# Patient Record
Sex: Male | Born: 1960
Health system: Southern US, Community
[De-identification: ages and names within clinical notes are randomized; demographics above are authoritative.]

## PROBLEM LIST (undated history)

## (undated) DIAGNOSIS — I10 Essential (primary) hypertension: Secondary | ICD-10-CM

## (undated) DIAGNOSIS — I2510 Atherosclerotic heart disease of native coronary artery without angina pectoris: Secondary | ICD-10-CM

---

## 2004-10-16 ENCOUNTER — Emergency Department (HOSPITAL_COMMUNITY): Admission: EM | Admit: 2004-10-16 | Discharge: 2004-10-16 | Payer: Self-pay | Admitting: Emergency Medicine

## 2009-09-06 ENCOUNTER — Emergency Department (HOSPITAL_COMMUNITY): Admission: EM | Admit: 2009-09-06 | Discharge: 2009-09-06 | Payer: Self-pay | Admitting: Emergency Medicine

## 2009-12-03 ENCOUNTER — Ambulatory Visit: Payer: Self-pay | Admitting: Oncology

## 2009-12-08 LAB — COMPREHENSIVE METABOLIC PANEL
Alkaline Phosphatase: 79 U/L (ref 39–117)
BUN: 11 mg/dL (ref 6–23)
CO2: 29 mEq/L (ref 19–32)
Chloride: 102 mEq/L (ref 96–112)
Glucose, Bld: 89 mg/dL (ref 70–99)
Potassium: 3.7 mEq/L (ref 3.5–5.3)
Sodium: 139 mEq/L (ref 135–145)
Total Bilirubin: 1.7 mg/dL — ABNORMAL HIGH (ref 0.3–1.2)
Total Protein: 7.5 g/dL (ref 6.0–8.3)

## 2009-12-08 LAB — CBC WITH DIFFERENTIAL/PLATELET
Basophils Absolute: 0 10*3/uL (ref 0.0–0.1)
EOS%: 0.4 % (ref 0.0–7.0)
Eosinophils Absolute: 0 10*3/uL (ref 0.0–0.5)
HGB: 16.2 g/dL (ref 13.0–17.1)
MCV: 95.3 fL (ref 79.3–98.0)
NEUT#: 3.6 10*3/uL (ref 1.5–6.5)
RDW: 13.2 % (ref 11.0–14.6)
lymph#: 2.2 10*3/uL (ref 0.9–3.3)

## 2009-12-12 LAB — FERRITIN: Ferritin: 404 ng/mL — ABNORMAL HIGH (ref 22–322)

## 2009-12-12 LAB — IRON AND TIBC: Iron: 148 ug/dL (ref 42–165)

## 2009-12-19 ENCOUNTER — Ambulatory Visit (HOSPITAL_COMMUNITY): Admission: RE | Admit: 2009-12-19 | Discharge: 2009-12-19 | Payer: Self-pay | Admitting: Oncology

## 2010-11-08 LAB — COMPREHENSIVE METABOLIC PANEL
ALT: 90 U/L — ABNORMAL HIGH (ref 0–53)
AST: 51 U/L — ABNORMAL HIGH (ref 0–37)
Albumin: 4.5 g/dL (ref 3.5–5.2)
BUN: 15 mg/dL (ref 6–23)
CO2: 26 mEq/L (ref 19–32)
Calcium: 9.2 mg/dL (ref 8.4–10.5)
Chloride: 99 mEq/L (ref 96–112)
Creatinine, Ser: 0.72 mg/dL (ref 0.4–1.5)
GFR calc non Af Amer: >60 mL/min (ref >60)
Glucose, Bld: 96 mg/dL (ref 70–99)
Total Protein: 7.5 g/dL (ref 6.0–8.3)

## 2010-11-08 LAB — CBC
HCT: 48.7 % (ref 39.0–52.0)
Hemoglobin: 17.4 g/dL — ABNORMAL HIGH (ref 13.0–17.0)
MCHC: 35.7 g/dL (ref 30.0–36.0)
RBC: 5.21 MIL/uL (ref 4.22–5.81)

## 2010-11-08 LAB — DIFFERENTIAL
Eosinophils Relative: 1 % (ref 0–5)
Lymphocytes Relative: 33 % (ref 12–46)
Lymphs Abs: 2.4 10*3/uL (ref 0.7–4.0)
Monocytes Absolute: 0.4 10*3/uL (ref 0.1–1.0)

## 2010-11-08 LAB — POCT CARDIAC MARKERS

## 2016-04-07 ENCOUNTER — Ambulatory Visit: Payer: Self-pay

## 2018-04-26 ENCOUNTER — Other Ambulatory Visit: Payer: Self-pay

## 2018-04-26 ENCOUNTER — Encounter (HOSPITAL_COMMUNITY): Payer: Self-pay | Admitting: Emergency Medicine

## 2018-04-26 ENCOUNTER — Emergency Department (HOSPITAL_COMMUNITY)
Admission: EM | Admit: 2018-04-26 | Discharge: 2018-04-27 | Disposition: A | Discharge status: Home / Self Care | Payer: Self-pay | Attending: Emergency Medicine | Admitting: Emergency Medicine

## 2018-04-26 ENCOUNTER — Emergency Department (HOSPITAL_COMMUNITY): Payer: Self-pay

## 2018-04-26 DIAGNOSIS — I10 Essential (primary) hypertension: Secondary | ICD-10-CM | POA: Insufficient documentation

## 2018-04-26 DIAGNOSIS — R079 Chest pain, unspecified: Secondary | ICD-10-CM | POA: Insufficient documentation

## 2018-04-26 LAB — BASIC METABOLIC PANEL
ANION GAP: 11 (ref 5–15)
BUN: 17 mg/dL (ref 6–20)
CALCIUM: 9.3 mg/dL (ref 8.9–10.3)
CO2: 25 mmol/L (ref 22–32)
Chloride: 103 mmol/L (ref 98–111)
Creatinine, Ser: 1.08 mg/dL (ref 0.61–1.24)
GFR calc Af Amer: >60 mL/min (ref >60)
GFR calc non Af Amer: >60 mL/min (ref >60)
GLUCOSE: 105 mg/dL — AB (ref 70–99)
POTASSIUM: 3.7 mmol/L (ref 3.5–5.1)
Sodium: 139 mmol/L (ref 135–145)

## 2018-04-26 LAB — CBC WITH DIFFERENTIAL/PLATELET
Abs Immature Granulocytes: 0 10*3/uL (ref 0.0–0.1)
BASOS ABS: 0 10*3/uL (ref 0.0–0.1)
BASOS PCT: 0 %
EOS ABS: 0.1 10*3/uL (ref 0.0–0.7)
EOS PCT: 2 %
HCT: 44.5 % (ref 39.0–52.0)
Hemoglobin: 15.7 g/dL (ref 13.0–17.0)
Immature Granulocytes: 0 %
Lymphocytes Relative: 38 %
Lymphs Abs: 2.3 10*3/uL (ref 0.7–4.0)
MCH: 33.2 pg (ref 26.0–34.0)
MCHC: 35.3 g/dL (ref 30.0–36.0)
MCV: 94.1 fL (ref 78.0–100.0)
MONO ABS: 0.6 10*3/uL (ref 0.1–1.0)
Monocytes Relative: 9 %
Neutro Abs: 3 10*3/uL (ref 1.7–7.7)
Neutrophils Relative %: 51 %
Platelets: 162 10*3/uL (ref 150–400)
RBC: 4.73 MIL/uL (ref 4.22–5.81)
RDW: 12 % (ref 11.5–15.5)
WBC: 6 10*3/uL (ref 4.0–10.5)

## 2018-04-26 MED ORDER — METOPROLOL TARTRATE 25 MG PO TABS
50.0000 mg | ORAL_TABLET | Freq: Once | ORAL | Status: AC
  Administered 2018-04-26: 50 mg via ORAL
  Filled 2018-04-26: qty 2

## 2018-04-26 NOTE — ED Triage Notes (Signed)
Pt states they took his BP at home and it was elevated in the 190 systolic range. Pt is compliant with BP meds at home. No other symptoms .

## 2018-04-26 NOTE — ED Provider Notes (Signed)
[facility] EMERGENCY DEPARTMENT Provider Note   CSN: [id number] Arrival date & time: 04/26/18  2142     History   Chief Complaint Chief Complaint  Patient presents with  . Hypertension    HPI Andrew Hunter is a 57 y.o. male.  The history is provided by the patient and medical records.  Hypertension     57 year old male with history of hypertension and BPH, presenting to the ED with high blood pressure.  Patient reports for the past few days he has had a "uneasy feeling" and has felt that his blood pressure has been elevated.  States he checked it twice this evening and was elevated into the 190's systolic both times.  States this made him panic and get anxious so he decided to come in.  States he takes losartan 50 mg in the morning and metoprolol 50 mg in the evening.  He has not yet taking his nighttime dose of metoprolol today, however otherwise has not missed any doses.  States for the past few days he has been experiencing some "twinges" in his chest--he cannot tell if this is related to his anxiety or his blood pressure.  He has not had any shortness of breath, diaphoresis, nausea, vomiting, dizziness, or weakness.  He has no known cardiac history.  He is not a smoker.  He has no known significant family cardiac history.  Wife reports she thinks he has some of this problems because of his sleep apnea.  Reports he had a sleep study done about 10+ years ago and was told he had sleep apnea, however he was not given prescription for CPAP machine and so never received it.  Wife reports he snores very heavily at night and she wakes up several times and notices he is not breathing very well.  He has not had any follow-up since his initial sleep study.  Patient also has concern about his urination.  States lately when he feels anxious he gets sudden urge to urinate.  He has not had any difficulty urinating or straining, but does have sensation of incomplete voiding.  Wife  reports he will get up 1-2 times at night to urinate, usually around 2 or 3 AM.  Patient reports he had his prostate checked recently and was told he had a "large prostate" but all of his numbers were normal.  History reviewed. No pertinent past medical history.  There are no active problems to display for this patient.   History reviewed. No pertinent surgical history.      Home Medications    Prior to Admission medications   Not on File    Family History No family history on file.  Social History Social History   Tobacco Use  . Smoking status: Never Smoker  . Smokeless tobacco: Never Used  Substance Use Topics  . Alcohol use: Not on file  . Drug use: Not on file     Allergies   Patient has no allergy information on record.   Review of Systems Review of Systems  Constitutional:       HTN  All other systems reviewed and are negative.    Physical Exam Updated Vital Signs BP (!) 188/85 (BP Location: Right Arm)   Pulse 79   Temp 98.5 F (36.9 C)   Resp 17   SpO2 100%   Physical Exam  Constitutional: He is oriented to person, place, and time. He appears well-developed and well-nourished.  HENT:  Head: Normocephalic and atraumatic.  Mouth/Throat: Oropharynx is clear and moist.  Eyes: Pupils are equal, round, and reactive to light. Conjunctivae and EOM are normal.  Neck: Normal range of motion.  Cardiovascular: Normal rate, regular rhythm and normal heart sounds.  Pulmonary/Chest: Effort normal and breath sounds normal. No stridor. No respiratory distress.  Abdominal: Soft. Bowel sounds are normal. There is no tenderness. There is no rebound.  Musculoskeletal: Normal range of motion.  Neurological: He is alert and oriented to person, place, and time.  Skin: Skin is warm and dry.  Psychiatric: He has a normal mood and affect.  Nursing note and vitals reviewed.    ED Treatments / Results  Labs (all labs ordered are listed, but only abnormal results  are displayed) Labs Reviewed  BASIC METABOLIC PANEL - Abnormal; Notable for the following components:      Result Value   Glucose, Bld 105 (*)    All other components within normal limits  URINALYSIS, ROUTINE W REFLEX MICROSCOPIC - Abnormal; Notable for the following components:   Color, Urine COLORLESS (*)    Specific Gravity, Urine 1.003 (*)    Hgb urine dipstick SMALL (*)    All other components within normal limits  CBC WITH DIFFERENTIAL/PLATELET  I-STAT TROPONIN, ED    EKG None  Radiology Dg Chest 2 View  Result Date: 04/26/2018 CLINICAL DATA:  Chest pain EXAM: CHEST - 2 VIEW COMPARISON:  09/06/2009 FINDINGS: The heart size and mediastinal contours are within normal limits. Both lungs are clear. The visualized skeletal structures are unremarkable. IMPRESSION: No active cardiopulmonary disease. Electronically Signed   By: Jasmine Pang M.D.   On: 04/26/2018 23:32    Procedures Procedures (including critical care time)  Medications Ordered in ED Medications  metoprolol tartrate (LOPRESSOR) tablet 50 mg (50 mg Oral Given 04/26/18 2323)     Initial Impression / Assessment and Plan / ED Course  I have reviewed the triage vital signs and the nursing notes.  Pertinent labs & imaging results that were available during my care of the patient were reviewed by me and considered in my medical decision making (see chart for details).  57 year old male presenting to the ED with elevated blood pressure.  Blood pressure today in the 190 systolic which is high for him.  Takes losartan 50 mg in the morning, metoprolol 50 mg in the evening.  Has not yet had his evening metoprolol, has not missed any other doses of medication.  He is afebrile and nontoxic.  His blood pressure is elevated but not within urgency/emergency range.  Does report some vague chest pain over the past few days, unsure if this is related to his blood pressure or anxiety as he does get very nervous when her his blood  pressure is elevated.  EKG here is nonischemic.  Screening labs are overall reassuring.  No evidence of endorgan damage.  Chest x-ray is clear.  After dose of his home metoprolol, blood pressure has stabilized, 126/82 during my reassessment.  Denies any active chest pain here.  Feel he is stable for discharge home.  May need to adjust timing of his evening metoprolol to earlier in the evening.  Also recommended that he keep a log of his blood pressures, checking about the same time every day but no more than twice a day.  In regards to wife's concerns about sleep apnea, can follow-up with PCP for this.  He does report tiredness even after sleeping all night and wife's reports of snoring and cessation of breathing are  concerning for such.  May need repeat sleep study.  In regards to urinary symptoms-- UA without signs of infection.  It sounds like he does have incomplete voiding, likely secondary to BPH.  No incontinence or LE weakness concerning for cauda equina.  Can follow-up with PCP and/or urology for this.    Discussed plan with patient and wife, they both acknowledged understanding and agreed with plan of care.  Return precautions given for new or worsening symptoms.  Final Clinical Impressions(s) / ED Diagnoses   Final diagnoses:  Essential hypertension  Chest pain in adult    ED Discharge Orders    None       Garlon Hatchet, PA-C 04/27/18 7846    Arby Barrette, MD 05/11/18 1054

## 2018-04-27 LAB — URINALYSIS, ROUTINE W REFLEX MICROSCOPIC
BILIRUBIN URINE: NEGATIVE
Bacteria, UA: NONE SEEN
Glucose, UA: NEGATIVE mg/dL
Ketones, ur: NEGATIVE mg/dL
LEUKOCYTES UA: NEGATIVE
NITRITE: NEGATIVE
PROTEIN: NEGATIVE mg/dL
Specific Gravity, Urine: 1.003 — ABNORMAL LOW (ref 1.005–1.030)
pH: 7 (ref 5.0–8.0)

## 2018-04-27 LAB — I-STAT TROPONIN, ED: Troponin i, poc: 0 ng/mL (ref 0.00–0.08)

## 2018-04-27 NOTE — Discharge Instructions (Addendum)
Continue your regular blood pressure medications-- may want to take your metoprolol a little earlier in the evening, maybe around dinner time. Keep a log of your blood pressures, check no more than twice daily, preferably same time(s) every day. Follow-up with your primary care doctor-- you can talk to them about sleep study, etc. Return here for any new/acute changes.

## 2018-10-04 ENCOUNTER — Emergency Department (HOSPITAL_COMMUNITY): Payer: Medicaid Other

## 2018-10-04 ENCOUNTER — Encounter (HOSPITAL_COMMUNITY): Payer: Self-pay | Admitting: *Deleted

## 2018-10-04 ENCOUNTER — Inpatient Hospital Stay (HOSPITAL_COMMUNITY)
Admission: EM | Admit: 2018-10-04 | Discharge: 2018-10-06 | DRG: 247 | Disposition: A | Discharge status: Home / Self Care | Payer: Medicaid Other | Attending: Internal Medicine | Admitting: Internal Medicine

## 2018-10-04 DIAGNOSIS — I351 Nonrheumatic aortic (valve) insufficiency: Secondary | ICD-10-CM | POA: Diagnosis present

## 2018-10-04 DIAGNOSIS — I214 Non-ST elevation (NSTEMI) myocardial infarction: Principal | ICD-10-CM | POA: Diagnosis present

## 2018-10-04 DIAGNOSIS — Z7982 Long term (current) use of aspirin: Secondary | ICD-10-CM

## 2018-10-04 DIAGNOSIS — Z8249 Family history of ischemic heart disease and other diseases of the circulatory system: Secondary | ICD-10-CM

## 2018-10-04 DIAGNOSIS — Z79899 Other long term (current) drug therapy: Secondary | ICD-10-CM | POA: Diagnosis not present

## 2018-10-04 DIAGNOSIS — I10 Essential (primary) hypertension: Secondary | ICD-10-CM | POA: Diagnosis present

## 2018-10-04 DIAGNOSIS — Z955 Presence of coronary angioplasty implant and graft: Secondary | ICD-10-CM

## 2018-10-04 DIAGNOSIS — I2510 Atherosclerotic heart disease of native coronary artery without angina pectoris: Secondary | ICD-10-CM | POA: Diagnosis present

## 2018-10-04 DIAGNOSIS — Z800 Family history of malignant neoplasm of digestive organs: Secondary | ICD-10-CM

## 2018-10-04 HISTORY — DX: Atherosclerotic heart disease of native coronary artery without angina pectoris: I25.10

## 2018-10-04 HISTORY — DX: Essential (primary) hypertension: I10

## 2018-10-04 LAB — BASIC METABOLIC PANEL
ANION GAP: 10 (ref 5–15)
BUN: 13 mg/dL (ref 6–20)
CALCIUM: 9.2 mg/dL (ref 8.9–10.3)
CO2: 26 mmol/L (ref 22–32)
Chloride: 100 mmol/L (ref 98–111)
Creatinine, Ser: 0.88 mg/dL (ref 0.61–1.24)
GFR calc Af Amer: >60 mL/min (ref >60)
GFR calc non Af Amer: >60 mL/min (ref >60)
GLUCOSE: 150 mg/dL — AB (ref 70–99)
Potassium: 4 mmol/L (ref 3.5–5.1)
Sodium: 136 mmol/L (ref 135–145)

## 2018-10-04 LAB — CBC
HCT: 42.8 % (ref 39.0–52.0)
HEMOGLOBIN: 15.3 g/dL (ref 13.0–17.0)
MCH: 33.1 pg (ref 26.0–34.0)
MCHC: 35.7 g/dL (ref 30.0–36.0)
MCV: 92.6 fL (ref 80.0–100.0)
PLATELETS: 186 10*3/uL (ref 150–400)
RBC: 4.62 MIL/uL (ref 4.22–5.81)
RDW: 12.2 % (ref 11.5–15.5)
WBC: 5.1 10*3/uL (ref 4.0–10.5)
nRBC: 0 % (ref 0.0–0.2)

## 2018-10-04 LAB — I-STAT TROPONIN, ED
Troponin i, poc: 0.01 ng/mL (ref 0.00–0.08)
Troponin i, poc: 0.09 ng/mL (ref 0.00–0.08)

## 2018-10-04 LAB — TROPONIN I: Troponin I: <0.03 ng/mL (ref <0.03)

## 2018-10-04 LAB — CBG MONITORING, ED: GLUCOSE-CAPILLARY: 91 mg/dL (ref 70–99)

## 2018-10-04 MED ORDER — SODIUM CHLORIDE 0.9% FLUSH
3.0000 mL | Freq: Two times a day (BID) | INTRAVENOUS | Status: DC
  Administered 2018-10-04: 3 mL via INTRAVENOUS

## 2018-10-04 MED ORDER — LOSARTAN POTASSIUM 50 MG PO TABS
100.0000 mg | ORAL_TABLET | Freq: Every day | ORAL | Status: DC
  Administered 2018-10-05 – 2018-10-06 (×2): 100 mg via ORAL
  Filled 2018-10-04 (×3): qty 2

## 2018-10-04 MED ORDER — SODIUM CHLORIDE 0.9 % WEIGHT BASED INFUSION
3.0000 mL/kg/h | INTRAVENOUS | Status: DC
  Administered 2018-10-05: 3 mL/kg/h via INTRAVENOUS

## 2018-10-04 MED ORDER — ASPIRIN EC 81 MG PO TBEC
81.0000 mg | DELAYED_RELEASE_TABLET | Freq: Every day | ORAL | Status: DC
  Administered 2018-10-06: 10:00:00 81 mg via ORAL
  Filled 2018-10-04: qty 1

## 2018-10-04 MED ORDER — SODIUM CHLORIDE 0.9% FLUSH: 3.0000 mL | INTRAVENOUS | Status: DC | PRN

## 2018-10-04 MED ORDER — SODIUM CHLORIDE 0.9 % IV BOLUS
1000.0000 mL | Freq: Once | INTRAVENOUS | Status: AC
  Administered 2018-10-04: 1000 mL via INTRAVENOUS

## 2018-10-04 MED ORDER — SODIUM CHLORIDE 0.9% FLUSH
3.0000 mL | Freq: Once | INTRAVENOUS | Status: AC
  Administered 2018-10-05: 12:00:00 3 mL via INTRAVENOUS

## 2018-10-04 MED ORDER — SODIUM CHLORIDE 0.9 % WEIGHT BASED INFUSION
1.0000 mL/kg/h | INTRAVENOUS | Status: DC
  Administered 2018-10-05: 1 mL/kg/h via INTRAVENOUS

## 2018-10-04 MED ORDER — LOSARTAN POTASSIUM 50 MG PO TABS
100.0000 mg | ORAL_TABLET | Freq: Every day | ORAL | Status: DC
  Filled 2018-10-04: qty 2

## 2018-10-04 MED ORDER — ONDANSETRON HCL 4 MG/2ML IJ SOLN: 4.0000 mg | Freq: Four times a day (QID) | INTRAMUSCULAR | Status: DC | PRN

## 2018-10-04 MED ORDER — METOPROLOL TARTRATE 25 MG PO TABS
25.0000 mg | ORAL_TABLET | Freq: Two times a day (BID) | ORAL | Status: DC
  Administered 2018-10-04 – 2018-10-06 (×4): 25 mg via ORAL
  Filled 2018-10-04: qty 1
  Filled 2018-10-04: qty 2
  Filled 2018-10-04 (×2): qty 1

## 2018-10-04 MED ORDER — ASPIRIN EC 81 MG PO TBEC: 81.0000 mg | DELAYED_RELEASE_TABLET | Freq: Every day | ORAL | Status: DC

## 2018-10-04 MED ORDER — HEPARIN (PORCINE) 25000 UT/250ML-% IV SOLN
1200.0000 [IU]/h | INTRAVENOUS | Status: DC
  Administered 2018-10-04: 950 [IU]/h via INTRAVENOUS
  Filled 2018-10-04: qty 250

## 2018-10-04 MED ORDER — SODIUM CHLORIDE 0.9 % IV SOLN: 250.0000 mL | INTRAVENOUS | Status: DC | PRN

## 2018-10-04 MED ORDER — ASPIRIN 81 MG PO CHEW
81.0000 mg | CHEWABLE_TABLET | ORAL | Status: AC
  Administered 2018-10-05: 81 mg via ORAL
  Filled 2018-10-04: qty 1

## 2018-10-04 MED ORDER — ATORVASTATIN CALCIUM 80 MG PO TABS
80.0000 mg | ORAL_TABLET | Freq: Every day | ORAL | Status: DC
  Administered 2018-10-05: 80 mg via ORAL
  Filled 2018-10-04: qty 1

## 2018-10-04 MED ORDER — NITROGLYCERIN 0.4 MG SL SUBL: 0.4000 mg | SUBLINGUAL_TABLET | SUBLINGUAL | Status: DC | PRN

## 2018-10-04 MED ORDER — ASPIRIN 81 MG PO CHEW
324.0000 mg | CHEWABLE_TABLET | Freq: Once | ORAL | Status: AC
  Administered 2018-10-04: 324 mg via ORAL
  Filled 2018-10-04: qty 4

## 2018-10-04 MED ORDER — HEPARIN BOLUS VIA INFUSION
4000.0000 [IU] | Freq: Once | INTRAVENOUS | Status: AC
  Administered 2018-10-04: 4000 [IU] via INTRAVENOUS
  Filled 2018-10-04: qty 4000

## 2018-10-04 MED ORDER — FAMOTIDINE 20 MG PO TABS
20.0000 mg | ORAL_TABLET | Freq: Every day | ORAL | Status: DC
  Administered 2018-10-04 – 2018-10-06 (×3): 20 mg via ORAL
  Filled 2018-10-04 (×3): qty 1

## 2018-10-04 MED ORDER — ACETAMINOPHEN 325 MG PO TABS: 650.0000 mg | ORAL_TABLET | ORAL | Status: DC | PRN

## 2018-10-04 MED ORDER — NITROGLYCERIN IN D5W 200-5 MCG/ML-% IV SOLN
0.0000 ug/min | INTRAVENOUS | Status: DC
  Administered 2018-10-04: 5 ug/min via INTRAVENOUS
  Administered 2018-10-05: 15 ug/min via INTRAVENOUS
  Filled 2018-10-04: qty 250

## 2018-10-04 NOTE — Progress Notes (Signed)
ANTICOAGULATION CONSULT NOTE - Follow Up Consult  Pharmacy Consult for heparin Indication: NSTEMI  No Known Allergies  Patient Measurements: TBW: 79.4 kg Heparin Dosing Weight: 79.4 kg   Vital Signs: Temp: 98.6 F (37 C) (02/12 1351) Temp Source: Oral (02/12 1351) BP: 154/95 (02/12 1845) Pulse Rate: 73 (02/12 1845)  Labs: Recent Labs    10/04/18 1446  HGB 15.3  HCT 42.8  PLT 186  CREATININE 0.88    CrCl cannot be calculated (Unknown ideal weight.).  Assessment: 58 yo M presents with CP. Found to have NSTEMI. No anticoag PTA. CBC stable.  Goal of Therapy:  Heparin level 0.3-0.7 units/ml Monitor platelets by anticoagulation protocol: Yes   Plan:  Give heparin 4,000 unit bolus Start heparin gtt at 950 units/hr  Monitor daily heparin level, CBC, s/s of bleed  Armandina Stammer 10/04/2018,7:05 PM

## 2018-10-04 NOTE — ED Notes (Signed)
RN messaged admitting about PO medications.

## 2018-10-04 NOTE — ED Provider Notes (Signed)
[facility] EMERGENCY DEPARTMENT Provider Note   CSN: [id number] Arrival date & time: 10/04/18  1348     History   Chief Complaint Chief Complaint  Patient presents with  . Chest Pain    HPI Andrew Hunter is a 58 y.o. male with a past medical history of hypertension presents to ED for 1 week history of intermittent dizziness, chest pain shortness of breath.  States that his symptoms got worse in the past 2 days.  States that he will sometimes feel like the world is spinning when he stands up or is at work.  He does note paresthesias in left hand over the past several months.  He states that the chest pain has been intermittent with no specific aggravating or alleviating factor.  Is located in the left side of his chest.  No history of similar symptoms in the past.  Reports mild cough.  Denies any recent immobilization, history of MI, DVT, PE, abdominal pain, fever.  He has not tried any medications to help with his symptoms. Denies tobacco use. No personal or family history of CAD.  HPI  Past Medical History:  Diagnosis Date  . Essential hypertension     There are no active problems to display for this patient.   History reviewed. No pertinent surgical history.      Home Medications    Prior to Admission medications   Medication Sig Start Date End Date Taking? Authorizing Provider  losartan (COZAAR) 100 MG tablet Take 100 mg by mouth daily. 09/24/18  Yes [provider]  metoprolol tartrate (LOPRESSOR) 50 MG tablet Take 50 mg by mouth at bedtime. 02/14/18   [provider]    Family History History reviewed. No pertinent family history.  Social History Social History   Tobacco Use  . Smoking status: Never Smoker  . Smokeless tobacco: Never Used  Substance Use Topics  . Alcohol use: Not on file  . Drug use: Not on file     Allergies   Patient has no known allergies.   Review of Systems Review of Systems  Constitutional:  Negative for appetite change, chills and fever.  HENT: Negative for ear pain, rhinorrhea, sneezing and sore throat.   Eyes: Negative for photophobia and visual disturbance.  Respiratory: Positive for cough and shortness of breath. Negative for chest tightness and wheezing.   Cardiovascular: Positive for chest pain. Negative for palpitations.  Gastrointestinal: Negative for abdominal pain, blood in stool, constipation, diarrhea, nausea and vomiting.  Genitourinary: Negative for dysuria, hematuria and urgency.  Musculoskeletal: Negative for myalgias.  Skin: Negative for rash.  Neurological: Positive for dizziness. Negative for weakness and light-headedness.     Physical Exam Updated Vital Signs BP (!) 141/81   Pulse 79   Temp 98.6 F (37 C) (Oral)   Resp (!) 24   SpO2 97%   Physical Exam Vitals signs and nursing note reviewed.  Constitutional:      General: He is not in acute distress.    Appearance: He is well-developed.  HENT:     Head: Normocephalic and atraumatic.     Nose: Nose normal.  Eyes:     General: No scleral icterus.       Left eye: No discharge.     Conjunctiva/sclera: Conjunctivae normal.  Neck:     Musculoskeletal: Normal range of motion and neck supple.  Cardiovascular:     Rate and Rhythm: Normal rate and regular rhythm.     Heart sounds: Normal heart  sounds. No murmur. No friction rub. No gallop.   Pulmonary:     Effort: Pulmonary effort is normal. No respiratory distress.     Breath sounds: Normal breath sounds.  Abdominal:     General: Bowel sounds are normal. There is no distension.     Palpations: Abdomen is soft.     Tenderness: There is no abdominal tenderness. There is no guarding.  Musculoskeletal: Normal range of motion.     Comments: No lower extremity edema, erythema or calf tenderness bilaterally.  Skin:    General: Skin is warm and dry.     Findings: No rash.  Neurological:     General: No focal deficit present.     Mental Status: He  is alert and oriented to person, place, and time.     Cranial Nerves: No cranial nerve deficit.     Sensory: No sensory deficit.     Motor: No weakness or abnormal muscle tone.     Coordination: Coordination normal.      ED Treatments / Results  Labs (all labs ordered are listed, but only abnormal results are displayed) Labs Reviewed  BASIC METABOLIC PANEL - Abnormal; Notable for the following components:      Result Value   Glucose, Bld 150 (*)    All other components within normal limits  I-STAT TROPONIN, ED - Abnormal; Notable for the following components:   Troponin i, poc 0.09 (*)    All other components within normal limits  CBC  I-STAT TROPONIN, ED  I-STAT TROPONIN, ED  CBG MONITORING, ED    EKG EKG Interpretation  Date/Time:  Wednesday October 04 2018 13:56:45 EST Ventricular Rate:  94 PR Interval:  144 QRS Duration: 106 QT Interval:  374 QTC Calculation: 467 R Axis:   67 Text Interpretation:  Normal sinus rhythm Incomplete right bundle branch block Possible Inferior infarct , age undetermined Cannot rule out Anterior infarct , age undetermined Abnormal ECG Confirmed by Kennis Carina [phone]) on 10/04/2018 5:08:36 PM   Radiology Dg Chest 2 View  Result Date: 10/04/2018 CLINICAL DATA:  Chest pain and tingling in the finger tips. EXAM: CHEST - 2 VIEW COMPARISON:  04/26/2018 FINDINGS: The heart size and mediastinal contours are within normal limits. Both lungs are clear. The visualized skeletal structures are unremarkable. IMPRESSION: No active cardiopulmonary disease. Electronically Signed   By: Tollie Eth M.D.   On: 10/04/2018 14:30    Procedures Procedures (including critical care time)  CRITICAL CARE Performed by: Dietrich Pates   Total critical care time: 35 minutes  Critical care time was exclusive of separately billable procedures and treating other patients.  Critical care was necessary to treat or prevent imminent or life-threatening  deterioration.  Critical care was time spent personally by me on the following activities: development of treatment plan with patient and/or surrogate as well as nursing, discussions with consultants, evaluation of patient's response to treatment, examination of patient, obtaining history from patient or surrogate, ordering and performing treatments and interventions, ordering and review of laboratory studies, ordering and review of radiographic studies, pulse oximetry and re-evaluation of patient's condition.   Medications Ordered in ED Medications  sodium chloride flush (NS) 0.9 % injection 3 mL (has no administration in time range)  sodium chloride 0.9 % bolus 1,000 mL (1,000 mLs Intravenous New Bag/Given 10/04/18 1759)  aspirin chewable tablet 324 mg (has no administration in time range)     Initial Impression / Assessment and Plan / ED Course  I have reviewed  the triage vital signs and the nursing notes.  Pertinent labs & imaging results that were available during my care of the patient were reviewed by me and considered in my medical decision making (see chart for details).  Clinical Course as of Oct 05 1855  Wed Oct 04, 2018  1827 Troponin i, poc(!!): 0.09 [HK]    Clinical Course User Index [HK] Dietrich PatesKhatri, Dorothyann Mourer, [place]PA-C    58 year old male with past medical history of hypertension presents to ED for intermittent left-sided chest pain, shortness of breath, intermittent dizziness for the past 3 days.  Denies hemoptysis but does note occasional cough.  No history of similar symptoms in the past.  He has not tried any medications to help with his pain.  He is currently having "just a little" amount of chest pain.  Initial troponin was negative.  EKG shows tracing similar to prior.  BMP, CBC unremarkable.  Repeat troponin after 3 hours elevated to 0.09.  Initiated on aspirin, heparin and will consult cardiology for admission.    Portions of this note were generated with [employer] dictation  software. Dictation errors may occur despite best attempts at proofreading.   Final Clinical Impressions(s) / ED Diagnoses   Final diagnoses:  NSTEMI (non-ST elevated myocardial infarction) [facility](HCC)    ED Discharge Orders    None       Dietrich PatesKhatri, Shayon Trompeter, PA-C 10/04/18 2052    Sabas SousBero, Michael M, MD 10/04/18 2222

## 2018-10-04 NOTE — H&P (Addendum)
History & Physical    Patient ID: Andrew Hunter MRN: [id number], DOB/AGE: March 06, 1961   Admit date: 10/04/2018   Primary Physician: Patient, No Pcp Per Primary Cardiologist: New - G. Jacques NavyAcharya, MD  Patient Profile    58 year old male with a history of hypertension and otherwise no prior cardiac history, who presented to the emergency department today with left-sided chest pain and dyspnea and has been found to have an elevated troponin.  Past Medical History    Past Medical History:  Diagnosis Date  . Essential hypertension     History reviewed. No pertinent surgical history.   Allergies  No Known Allergies  History of Present Illness    58 year old male with a prior history of hypertension on losartan therapy as an outpatient.  He has no prior cardiac history, though he does have a family history of CAD, with his father dying of an myocardial infarction at age 60.  He says that over the past several months, he has been having intermittent rest and exertional left-sided chest discomfort sometimes associated with left arm tingling and dyspnea, occurring about 3 times a month, lasting about an hour, and resolving spontaneously.  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   He was in his usual state of health until this morning, when he was at work and lifting a heavy pipe off of the truck with the help of some coworkers.  At that point, he developed more severe left-sided chest pressure associated dyspnea, and tingling in his left arm and fingers.  He stopped working and symptoms persisted, prompting him to eventually present to the emergency department.  Here, ECG showed no acute changes while blood pressure was mildly elevated at 164/82.  Initial troponin was normal but subsequent troponin has returned at 0.09.  In that setting, he was just treated with  aspirin and heparin has been ordered.  He still reports mild 2-3 over 10 left-sided chest pressure and left arm tingling.  Home Medications    Prior to Admission medications   Medication Sig Start Date End Date Taking? Authorizing Provider  acetaminophen (TYLENOL) 500 MG tablet Take 500-1,000 mg by mouth every 6 (six) hours as needed (for pain or headaches).   Yes [provider]  aspirin EC 81 MG tablet Take 81 mg by mouth daily.   Yes [provider]  ibuprofen (ADVIL,MOTRIN) 200 MG tablet Take 400 mg by mouth every 6 (six) hours as needed (for pain).   Yes [provider]  losartan (COZAAR) 100 MG tablet Take 100 mg by mouth daily. 09/24/18  Yes [provider]  metoprolol tartrate (LOPRESSOR) 50 MG tablet Take 50 mg by mouth at bedtime. 02/14/18  Yes [provider]  Omega-3 Fatty Acids (FISH OIL PO) Take 1 capsule by mouth daily.   Yes [provider]  ranitidine (ZANTAC 75) 75 MG tablet Take 75 mg by mouth daily before breakfast.   Yes [provider]    Family History    Family History  Problem Relation Age of Onset  . Cancer Mother        liver  . Heart attack Father        died @ 559   He indicated that his mother is deceased. He indicated that his father is deceased.   Social History    Social History  Socioeconomic History  . Marital status: Single    Spouse name: Not on file  . Number of children: Not on file  . Years of education: Not on file  . Highest education level: Not on file  Occupational History  . Not on file  Social Needs  . Financial resource strain: Not on file  . Food insecurity:    Worry: Not on file    Inability: Not on file  . Transportation needs:    Medical: Not on file    Non-medical: Not on file  Tobacco Use  . Smoking status: Never Smoker  . Smokeless tobacco: Never Used  Substance and Sexual Activity  . Alcohol use: Yes    Comment: 2 beers/month  . Drug use: Never  .  Sexual activity: Not on file  Lifestyle  . Physical activity:    Days per week: Not on file    Minutes per session: Not on file  . Stress: Not on file  Relationships  . Social connections:    Talks on phone: Not on file    Gets together: Not on file    Attends religious service: Not on file    Active member of club or organization: Not on file    Attends meetings of clubs or organizations: Not on file    Relationship status: Not on file  . Intimate partner violence:    Fear of current or ex partner: Not on file    Emotionally abused: Not on file    Physically abused: Not on file    Forced sexual activity: Not on file  Other Topics Concern  . Not on file  Social History Narrative   Lives locally with wife.  Does not routinely exercise.  Works in Therapist, sports.     Review of Systems    General:  No chills, fever, night sweats or weight changes.  Cardiovascular:  +++ chest pain, +++ dyspnea on exertion, no edema, orthopnea, palpitations, paroxysmal nocturnal dyspnea. Dermatological: No rash, lesions/masses Respiratory: No cough, +++ dyspnea Urologic: No hematuria, dysuria Abdominal:   No nausea, vomiting, diarrhea, bright red blood per rectum, melena, or hematemesis Neurologic:  No visual changes, wkns, changes in mental status. All other systems reviewed and are otherwise negative except as noted above.  Physical Exam    Blood pressure (!) 153/93, pulse 87, temperature 98.6 F (37 C), temperature source Oral, resp. rate 13, height 5\' 7"  (1.702 m), weight 79.4 kg, SpO2 96 %.  General: Pleasant, NAD Psych: Normal affect. Neuro: Alert and oriented X 3. Moves all extremities spontaneously. HEENT: Normal  Neck: Supple without bruits or JVD. Lungs:  Resp regular and unlabored, CTA. Heart: RRR, 2/6 syst murmur @ LLSB, no s3, s4. Abdomen: Soft, non-tender, non-distended, BS + x 4.  Extremities: No clubbing, cyanosis or edema. DP/PT/Radials 2+ and equal bilaterally.  Labs      Troponin [facility] of Care Test) Recent Labs    10/04/18 1827  TROPIPOC 0.09*    Lab Results  Component Value Date   WBC 5.1 10/04/2018   HGB 15.3 10/04/2018   HCT 42.8 10/04/2018   MCV 92.6 10/04/2018   PLT 186 10/04/2018    Recent Labs  Lab 10/04/18 1446  NA 136  K 4.0  CL 100  CO2 26  BUN 13  CREATININE 0.88  CALCIUM 9.2  GLUCOSE 150*     Radiology Studies    Dg Chest 2 View  Result Date: 10/04/2018 CLINICAL DATA:  Chest pain and tingling  in the finger tips. EXAM: CHEST - 2 VIEW COMPARISON:  04/26/2018 FINDINGS: The heart size and mediastinal contours are within normal limits. Both lungs are clear. The visualized skeletal structures are unremarkable. IMPRESSION: No active cardiopulmonary disease. Electronically Signed   By: Tollie Ethavid  Kwon M.D.   On: 10/04/2018 14:30    ECG & Cardiac Imaging    RSR, 94, inc RBBB, inf infarct, no acute changes - personally reviewed.  Assessment & Plan    1.  NSTEMI: 58 year old male with a history of hypertension and family history of premature CAD, presented to the ER today with a several hour history of left-sided chest discomfort radiating to his left arm with tingling in his fingers.  He is also had dyspnea.  ECG nonacute however, initial point-of-care troponin was normal with follow-up elevated at 0.09.  He continues to report mild left-sided chest discomfort.  We will plan to admit and continue to cycle cardiac enzymes.  Adding aspirin, statin, beta-blocker, heparin, and IV nitro in the setting of ongoing symptoms and elevated blood pressure.  We will plan to pursue diagnostic catheterization tomorrow. The patient understands that risks include but are not limited to stroke (1 in 1000), death (1 in 1000), kidney failure [usually temporary] (1 in 500), bleeding (1 in 200), allergic reaction [possibly serious] (1 in 200), and agrees to proceed.    2.  Essential hypertension: Blood pressure elevated in the emergency department.  He is on  losartan at home.  Continue and add beta-blocker therapy in the setting of ongoing hypertension and ACS.  3.  Lipid status: Currently unknown.  I will add high potency statin therapy in the setting of acute coronary syndrome and follow-up lipids and LFTs in the a.m.  Signed, Nicolasa Duckinghristopher Berge, NP 10/04/2018, 8:31 PM   ---------------------------------------------------------------------------------------------   History and all data above reviewed.  Patient examined.  I agree with the findings as above.  Andrew SenderRoque Hunter is a pleasant 58 yo male with typical chest pain with exertion now occurring at rest, with evidence of ACS. He is currently experiencing chest pain at 3/10.   Constitutional: No acute distress Eyes: pupils equally round and reactive to light, sclera non-icteric, normal conjunctiva and lids ENMT: normal dentition, moist mucous membranes Cardiovascular: regular rhythm, normal rate, no murmurs. S1 and S2 normal. Radial pulses normal bilaterally. No jugular venous distention.  Respiratory: clear to auscultation bilaterally GI : normal bowel sounds, soft and nontender. No distention.   MSK: extremities warm, well perfused. No edema.  NEURO: grossly nonfocal exam, moves all extremities. PSYCH: alert and oriented x 3, normal mood and affect.   All available labs, radiology testing, previous records reviewed. Agree with documented assessment and plan of my colleague as stated above with the following additions or changes:  Active Problems:   NSTEMI (non-ST elevated myocardial infarction) [facility](HCC)    Plan: plan for coronary angiography tomorrow for NSTEMI. NPO at midnight. Counseled with wife and daughter present on risks and benefits. Informed consent obtained by both myself as well as C. Brion AlimentBerge and documented above.   Continue heparin, initiate nitroglycerin infusion for hypertension and chest pain. Can add home losartan for hypertension and add bblocker.   Admit to cardiology.     Parke PoissonGayatri A Acharya, MD [facility]

## 2018-10-04 NOTE — ED Notes (Signed)
Heparin verified with Linus Orn

## 2018-10-04 NOTE — ED Triage Notes (Signed)
Pt in c/o chest pain for the last few days. Also intermittent episodes of dizziness, no distress noted, denies n/v or shortness of breath

## 2018-10-05 ENCOUNTER — Inpatient Hospital Stay (HOSPITAL_COMMUNITY): Payer: Medicaid Other

## 2018-10-05 ENCOUNTER — Encounter (HOSPITAL_COMMUNITY)
Admission: EM | Disposition: A | Discharge status: Home / Self Care | Payer: Self-pay | Source: Home / Self Care | Attending: Internal Medicine

## 2018-10-05 ENCOUNTER — Other Ambulatory Visit: Payer: Self-pay

## 2018-10-05 ENCOUNTER — Encounter (HOSPITAL_COMMUNITY): Payer: Self-pay | Admitting: Cardiovascular Disease

## 2018-10-05 DIAGNOSIS — I214 Non-ST elevation (NSTEMI) myocardial infarction: Secondary | ICD-10-CM

## 2018-10-05 DIAGNOSIS — I2510 Atherosclerotic heart disease of native coronary artery without angina pectoris: Secondary | ICD-10-CM

## 2018-10-05 HISTORY — PX: INTRAVASCULAR PRESSURE WIRE/FFR STUDY: CATH118243

## 2018-10-05 HISTORY — PX: LEFT HEART CATH AND CORONARY ANGIOGRAPHY: CATH118249

## 2018-10-05 HISTORY — PX: CORONARY STENT INTERVENTION: CATH118234

## 2018-10-05 LAB — HEPARIN LEVEL (UNFRACTIONATED): HEPARIN UNFRACTIONATED: 0.19 [IU]/mL — AB (ref 0.30–0.70)

## 2018-10-05 LAB — LIPID PANEL
CHOL/HDL RATIO: 3.6 ratio
Cholesterol: 181 mg/dL (ref 0–200)
HDL: 50 mg/dL (ref >40)
LDL Cholesterol: 98 mg/dL (ref 0–99)
Triglycerides: 167 mg/dL — ABNORMAL HIGH (ref <150)
VLDL: 33 mg/dL (ref 0–40)

## 2018-10-05 LAB — BASIC METABOLIC PANEL
Anion gap: 17 — ABNORMAL HIGH (ref 5–15)
BUN: 11 mg/dL (ref 6–20)
CO2: 17 mmol/L — ABNORMAL LOW (ref 22–32)
CREATININE: 0.91 mg/dL (ref 0.61–1.24)
Calcium: 8.8 mg/dL — ABNORMAL LOW (ref 8.9–10.3)
Chloride: 106 mmol/L (ref 98–111)
GFR calc non Af Amer: >60 mL/min (ref >60)
Glucose, Bld: 96 mg/dL (ref 70–99)
Potassium: 4.4 mmol/L (ref 3.5–5.1)
SODIUM: 140 mmol/L (ref 135–145)

## 2018-10-05 LAB — CBC
HCT: 40.5 % (ref 39.0–52.0)
Hemoglobin: 14.6 g/dL (ref 13.0–17.0)
MCH: 33.1 pg (ref 26.0–34.0)
MCHC: 36 g/dL (ref 30.0–36.0)
MCV: 91.8 fL (ref 80.0–100.0)
Platelets: 174 10*3/uL (ref 150–400)
RBC: 4.41 MIL/uL (ref 4.22–5.81)
RDW: 12.1 % (ref 11.5–15.5)
WBC: 7.6 10*3/uL (ref 4.0–10.5)
nRBC: 0 % (ref 0.0–0.2)

## 2018-10-05 LAB — POCT ACTIVATED CLOTTING TIME
Activated Clotting Time: 384 seconds
Activated Clotting Time: 395 seconds

## 2018-10-05 LAB — ECHOCARDIOGRAM COMPLETE
Height: 67 in
Weight: 2812.8 oz

## 2018-10-05 LAB — TROPONIN I
Troponin I: <0.03 ng/mL (ref <0.03)
Troponin I: <0.03 ng/mL (ref <0.03)

## 2018-10-05 SURGERY — LEFT HEART CATH AND CORONARY ANGIOGRAPHY
Anesthesia: LOCAL

## 2018-10-05 MED ORDER — ADENOSINE 12 MG/4ML IV SOLN
INTRAVENOUS | Status: AC
  Filled 2018-10-05: qty 4

## 2018-10-05 MED ORDER — SODIUM CHLORIDE 0.9% FLUSH: 3.0000 mL | Freq: Two times a day (BID) | INTRAVENOUS | Status: DC

## 2018-10-05 MED ORDER — TICAGRELOR 90 MG PO TABS
ORAL_TABLET | ORAL | Status: AC
  Filled 2018-10-05: qty 1

## 2018-10-05 MED ORDER — ATORVASTATIN CALCIUM 80 MG PO TABS: 80.0000 mg | ORAL_TABLET | Freq: Every day | ORAL | Status: DC

## 2018-10-05 MED ORDER — THE SENSUOUS HEART BOOK
Freq: Once | Status: DC
  Filled 2018-10-05: qty 1

## 2018-10-05 MED ORDER — LIDOCAINE HCL (PF) 1 % IJ SOLN
INTRAMUSCULAR | Status: DC | PRN
  Administered 2018-10-05: 3 mL

## 2018-10-05 MED ORDER — HYDRALAZINE HCL 20 MG/ML IJ SOLN: 5.0000 mg | INTRAMUSCULAR | Status: AC | PRN

## 2018-10-05 MED ORDER — MORPHINE SULFATE (PF) 2 MG/ML IV SOLN: 2.0000 mg | INTRAVENOUS | Status: DC | PRN

## 2018-10-05 MED ORDER — HEPARIN (PORCINE) IN NACL 1000-0.9 UT/500ML-% IV SOLN
INTRAVENOUS | Status: AC
  Filled 2018-10-05: qty 1000

## 2018-10-05 MED ORDER — VERAPAMIL HCL 2.5 MG/ML IV SOLN
INTRAVENOUS | Status: AC
  Filled 2018-10-05: qty 2

## 2018-10-05 MED ORDER — HEPARIN (PORCINE) IN NACL 1000-0.9 UT/500ML-% IV SOLN
INTRAVENOUS | Status: DC | PRN
  Administered 2018-10-05: 500 mL

## 2018-10-05 MED ORDER — FENTANYL CITRATE (PF) 100 MCG/2ML IJ SOLN
INTRAMUSCULAR | Status: AC
  Filled 2018-10-05: qty 2

## 2018-10-05 MED ORDER — LIDOCAINE HCL (PF) 1 % IJ SOLN
INTRAMUSCULAR | Status: AC
  Filled 2018-10-05: qty 30

## 2018-10-05 MED ORDER — HEPARIN SODIUM (PORCINE) 1000 UNIT/ML IJ SOLN
INTRAMUSCULAR | Status: AC
  Filled 2018-10-05: qty 1

## 2018-10-05 MED ORDER — FENTANYL CITRATE (PF) 100 MCG/2ML IJ SOLN
INTRAMUSCULAR | Status: DC | PRN
  Administered 2018-10-05: 25 ug via INTRAVENOUS

## 2018-10-05 MED ORDER — TICAGRELOR 90 MG PO TABS
ORAL_TABLET | ORAL | Status: DC | PRN
  Administered 2018-10-05: 180 mg via ORAL

## 2018-10-05 MED ORDER — ONDANSETRON HCL 4 MG/2ML IJ SOLN: 4.0000 mg | Freq: Four times a day (QID) | INTRAMUSCULAR | Status: DC | PRN

## 2018-10-05 MED ORDER — SODIUM CHLORIDE 0.9 % IV SOLN
INTRAVENOUS | Status: AC
  Administered 2018-10-05: 12:00:00 via INTRAVENOUS

## 2018-10-05 MED ORDER — HEPARIN SODIUM (PORCINE) 1000 UNIT/ML IJ SOLN
INTRAMUSCULAR | Status: DC | PRN
  Administered 2018-10-05: 4000 [IU] via INTRAVENOUS
  Administered 2018-10-05: 6000 [IU] via INTRAVENOUS

## 2018-10-05 MED ORDER — NITROGLYCERIN 1 MG/10 ML FOR IR/CATH LAB
INTRA_ARTERIAL | Status: DC | PRN
  Administered 2018-10-05: 200 ug via INTRACORONARY

## 2018-10-05 MED ORDER — VERAPAMIL HCL 2.5 MG/ML IV SOLN
INTRA_ARTERIAL | Status: DC | PRN
  Administered 2018-10-05: 10 mL via INTRA_ARTERIAL

## 2018-10-05 MED ORDER — MIDAZOLAM HCL 2 MG/2ML IJ SOLN
INTRAMUSCULAR | Status: DC | PRN
  Administered 2018-10-05: 1 mg via INTRAVENOUS

## 2018-10-05 MED ORDER — IOHEXOL 350 MG/ML SOLN
INTRAVENOUS | Status: DC | PRN
  Administered 2018-10-05: 175 mL via INTRA_ARTERIAL

## 2018-10-05 MED ORDER — ADENOSINE 12 MG/4ML IV SOLN
INTRAVENOUS | Status: AC
  Filled 2018-10-05: qty 12

## 2018-10-05 MED ORDER — SODIUM CHLORIDE 0.9% FLUSH: 3.0000 mL | INTRAVENOUS | Status: DC | PRN

## 2018-10-05 MED ORDER — ADENOSINE (DIAGNOSTIC) 140MCG/KG/MIN
INTRAVENOUS | Status: DC | PRN
  Administered 2018-10-05: 140 ug/kg/min via INTRAVENOUS

## 2018-10-05 MED ORDER — ASPIRIN 81 MG PO CHEW: 81.0000 mg | CHEWABLE_TABLET | Freq: Every day | ORAL | Status: DC

## 2018-10-05 MED ORDER — MIDAZOLAM HCL 2 MG/2ML IJ SOLN
INTRAMUSCULAR | Status: AC
  Filled 2018-10-05: qty 2

## 2018-10-05 MED ORDER — ACETAMINOPHEN 325 MG PO TABS
650.0000 mg | ORAL_TABLET | ORAL | Status: DC | PRN
  Administered 2018-10-05: 15:00:00 650 mg via ORAL
  Filled 2018-10-05: qty 2

## 2018-10-05 MED ORDER — SODIUM CHLORIDE 0.9 % IV SOLN: 250.0000 mL | INTRAVENOUS | Status: DC | PRN

## 2018-10-05 MED ORDER — LABETALOL HCL 5 MG/ML IV SOLN: 10.0000 mg | INTRAVENOUS | Status: AC | PRN

## 2018-10-05 MED ORDER — NITROGLYCERIN 1 MG/10 ML FOR IR/CATH LAB
INTRA_ARTERIAL | Status: AC
  Filled 2018-10-05: qty 10

## 2018-10-05 MED ORDER — ANGIOPLASTY BOOK
Freq: Once | Status: DC
  Filled 2018-10-05: qty 1

## 2018-10-05 MED ORDER — HEPARIN BOLUS VIA INFUSION
2000.0000 [IU] | Freq: Once | INTRAVENOUS | Status: AC
  Administered 2018-10-05: 2000 [IU] via INTRAVENOUS
  Filled 2018-10-05: qty 2000

## 2018-10-05 MED ORDER — HEART ATTACK BOUNCING BOOK
Freq: Once | Status: DC
  Filled 2018-10-05: qty 1

## 2018-10-05 MED ORDER — TICAGRELOR 90 MG PO TABS
90.0000 mg | ORAL_TABLET | Freq: Two times a day (BID) | ORAL | Status: DC
  Administered 2018-10-05 – 2018-10-06 (×2): 90 mg via ORAL
  Filled 2018-10-05 (×2): qty 1

## 2018-10-05 SURGICAL SUPPLY — 21 items
BALLN SAPPHIRE ~~LOC~~ 2.5X15 (BALLOONS) ×2 IMPLANT
CATH INFINITI 5FR ANG PIGTAIL (CATHETERS) ×2 IMPLANT
CATH MICROCATH NAVVUS (MICROCATHETER) ×1 IMPLANT
CATH OPTITORQUE TIG 4.0 5F (CATHETERS) ×2 IMPLANT
CATH VISTA GUIDE 6FR XBLAD3.5 (CATHETERS) ×2 IMPLANT
DEVICE RAD COMP TR BAND LRG (VASCULAR PRODUCTS) ×2 IMPLANT
GLIDESHEATH SLEND A-KIT 6F 22G (SHEATH) ×2 IMPLANT
GUIDEWIRE INQWIRE 1.5J.035X260 (WIRE) ×1 IMPLANT
GUIDEWIRE PRESSURE COMET II (WIRE) ×2 IMPLANT
INQWIRE 1.5J .035X260CM (WIRE) ×2
KIT ENCORE 26 ADVANTAGE (KITS) ×2 IMPLANT
KIT ESSENTIALS PG (KITS) ×2 IMPLANT
KIT HEART LEFT (KITS) ×2 IMPLANT
MICROCATHETER NAVVUS (MICROCATHETER) ×2
PACK CARDIAC CATHETERIZATION (CUSTOM PROCEDURE TRAY) ×2 IMPLANT
STENT SYNERGY DES 2.25X20 (Permanent Stent) ×2 IMPLANT
SYR MEDRAD MARK 7 150ML (SYRINGE) ×2 IMPLANT
TRANSDUCER W/STOPCOCK (MISCELLANEOUS) ×2 IMPLANT
TUBING CIL FLEX 10 FLL-RA (TUBING) ×2 IMPLANT
WIRE ASAHI PROWATER 180CM (WIRE) ×2 IMPLANT
WIRE HI TORQ VERSACORE-J 145CM (WIRE) ×2 IMPLANT

## 2018-10-05 NOTE — Progress Notes (Signed)
  Echocardiogram 2D Echocardiogram has been performed.  Leta Jungling M 10/05/2018, 2:07 PM

## 2018-10-05 NOTE — Progress Notes (Signed)
ANTICOAGULATION CONSULT NOTE - Follow Up Consult  Pharmacy Consult for heparin Indication: NSTEMI  Labs: Recent Labs    10/04/18 1446 10/04/18 2144 10/05/18 0252  HGB 15.3  --  14.6  HCT 42.8  --  40.5  PLT 186  --  174  HEPARINUNFRC  --   --  0.19*  CREATININE 0.88  --   --   TROPONINI  --  <0.03  --     Assessment: 57yo male subtherapeutic on heparin with initial dosing for NSTEMI; no gtt issues or signs of bleeding per RN.  Goal of Therapy:  Heparin level 0.3-0.7 units/ml   Plan:  Will rebolus with heparin 2000 units and increase heparin gtt by 3 units/kg/hr to 1200 units/hr and check level with next lab draw.    Vernard Gambles, PharmD, BCPS  10/05/2018,3:57 AM

## 2018-10-05 NOTE — Interval H&P Note (Signed)
Cath Lab Visit (complete for each Cath Lab visit)  Clinical Evaluation Leading to the Procedure:   ACS: Yes.    Non-ACS:    Anginal Classification: CCS III  Anti-ischemic medical therapy: Minimal Therapy (1 class of medications)  Non-Invasive Test Results: No non-invasive testing performed  Prior CABG: No previous CABG      History and Physical Interval Note:  10/05/2018 7:36 AM  Andrew Hunter  has presented today for surgery, with the diagnosis of unstable angina  The various methods of treatment have been discussed with the patient and family. After consideration of risks, benefits and other options for treatment, the patient has consented to  Procedure(s): LEFT HEART CATH AND CORONARY ANGIOGRAPHY (N/A) as a surgical intervention .  The patient's history has been reviewed, patient examined, no change in status, stable for surgery.  I have reviewed the patient's chart and labs.  Questions were answered to the patient's satisfaction.     Nanetta Batty

## 2018-10-05 NOTE — Care Management Note (Addendum)
Case Management Note  Patient Details  Name: Enderson Riopelle MRN: [id number] Date of Birth: 08-26-1960  Subjective/Objective:  From home, s/p stent intervention, will be on brilinta, has no insurance, no pcp.  NCM scheduled hospital follow up with [facility] 3/12 at 9:30.  NCM gave patient the brochure for [facility], patient ast application and coupon for first 30 days free.  He would like [facility] pharmacy to fill scripts prior to dc.              Action/Plan: DC home once [facility] pharmacy has filled scripts.   Expected Discharge Date:                  Expected Discharge Plan:  Home/Self Care  In-House Referral:     Discharge planning Services  CM Consult, [facility], Follow-up appt scheduled, Medication Assistance  Post Acute Care Choice:    Choice offered to:     DME Arranged:    DME Agency:     HH Arranged:    HH Agency:     Status of Service:  In process, will continue to follow  If discussed at Long Length of Stay Meetings, dates discussed:    Additional Comments:  Leone Haven, RN 10/05/2018, 10:01 AM

## 2018-10-06 LAB — BASIC METABOLIC PANEL
Anion gap: 9 (ref 5–15)
BUN: 11 mg/dL (ref 6–20)
CO2: 24 mmol/L (ref 22–32)
Calcium: 9 mg/dL (ref 8.9–10.3)
Chloride: 105 mmol/L (ref 98–111)
Creatinine, Ser: 0.8 mg/dL (ref 0.61–1.24)
GFR calc non Af Amer: >60 mL/min (ref >60)
Glucose, Bld: 103 mg/dL — ABNORMAL HIGH (ref 70–99)
Potassium: 3.9 mmol/L (ref 3.5–5.1)
SODIUM: 138 mmol/L (ref 135–145)

## 2018-10-06 LAB — CBC
HCT: 43 % (ref 39.0–52.0)
Hemoglobin: 14.9 g/dL (ref 13.0–17.0)
MCH: 31.8 pg (ref 26.0–34.0)
MCHC: 34.7 g/dL (ref 30.0–36.0)
MCV: 91.7 fL (ref 80.0–100.0)
Platelets: 188 10*3/uL (ref 150–400)
RBC: 4.69 MIL/uL (ref 4.22–5.81)
RDW: 12.3 % (ref 11.5–15.5)
WBC: 7.2 10*3/uL (ref 4.0–10.5)
nRBC: 0 % (ref 0.0–0.2)

## 2018-10-06 LAB — HEMOGLOBIN A1C
HEMOGLOBIN A1C: 6 % — AB (ref 4.8–5.6)
Mean Plasma Glucose: 126 mg/dL

## 2018-10-06 MED ORDER — NITROGLYCERIN 0.4 MG SL SUBL: 0.4000 mg | SUBLINGUAL_TABLET | SUBLINGUAL | 1 refills | Status: DC | PRN

## 2018-10-06 MED ORDER — ASPIRIN EC 81 MG PO TBEC: 81.0000 mg | DELAYED_RELEASE_TABLET | Freq: Every day | ORAL | 3 refills | Status: DC

## 2018-10-06 MED ORDER — TICAGRELOR 90 MG PO TABS: 90.0000 mg | ORAL_TABLET | Freq: Two times a day (BID) | ORAL | 11 refills | Status: DC

## 2018-10-06 MED ORDER — ATORVASTATIN CALCIUM 80 MG PO TABS: 80.0000 mg | ORAL_TABLET | Freq: Every day | ORAL | 11 refills | Status: DC

## 2018-10-06 MED ORDER — METOPROLOL TARTRATE 25 MG PO TABS: 25.0000 mg | ORAL_TABLET | Freq: Two times a day (BID) | ORAL | 11 refills | Status: DC

## 2018-10-06 MED FILL — METOPROLOL TARTRATE 25 MG T: 25 | 30 days supply | Qty: 60 | Fill #0 | Status: TO

## 2018-10-06 MED FILL — ATORVASTATIN CALCIUM 80 MG: 80 | 30 days supply | Qty: 30 | Fill #0 | Status: TO

## 2018-10-06 MED FILL — NITROGLYCERIN 0.4 MG TAB SL: 0.4 | 8 days supply | Qty: 25 | Fill #0 | Status: TO

## 2018-10-06 MED FILL — BRILINTA 90 MG TABLET: 90 | 30 days supply | Qty: 60 | Fill #0 | Status: TO

## 2018-10-06 MED FILL — ASPIRIN LOW DOSE 81 MG TBEC: 81 | 90 days supply | Qty: 90 | Fill #0 | Status: TO

## 2018-10-06 NOTE — Discharge Summary (Addendum)
Discharge Summary    Patient ID: Loma SenderRoque Chappuis MRN: [id number]; DOB: Jun 28, 1961  Admit date: 10/04/2018 Discharge date: 10/06/2018  Primary Care Provider: Patient, No Pcp Per  Primary Cardiologist: Parke PoissonGayatri A Acharya, MD   Discharge Diagnoses    Active Problems:   NSTEMI (non-ST elevated myocardial infarction) (HCC)   Allergies No Known Allergies  Diagnostic Studies/Procedures    CORONARY STENT INTERVENTION  INTRAVASCULAR PRESSURE WIRE/FFR STUDY  LEFT HEART CATH AND CORONARY ANGIOGRAPHY  Conclusion     Prox RCA lesion is 30% stenosed.  Ost 1st Mrg lesion is 60% stenosed.  Prox LAD to Mid LAD lesion is 50% stenosed.  The left ventricular systolic function is normal.  LV end diastolic pressure is normal.  The left ventricular ejection fraction is 55-65% by visual estimate.    IMPRESSION: Successful FFR guided mid LAD PCI and drug-eluting stenting of an "intermediate lesion" with an FFR 0.79.  Patient had excellent angiographic result with a synergy stent and a maximal post dilatation diameter of 2.63 mm.  He does have a 60 to 70% ostial first obtuse marginal branch stenosis.  This is a moderate size vessel which will be treated medically at this time.  He has normal LV function.  He will be treated with dual antiplatelet therapy including aspirin and Brilinta for at least 1 year uninterrupted.  Anticipate that he will be discharged home in the morning.    Echo 10/05/18 IMPRESSIONS    1. The left ventricle has normal systolic function with an ejection fraction of 60-65%. The cavity size was normal. Left ventricular diastolic Doppler parameters are consistent with impaired relaxation.  2. The right ventricle has normal systolic function. The cavity was normal. There is no increase in right ventricular wall thickness.  3. The mitral valve is normal in structure.  4. The tricuspid valve is normal in structure.  5. The aortic valve is tricuspid Aortic valve regurgitation  is trivial by color flow Doppler.  6. The pulmonic valve was normal in structure.  7. Normal LV systolic function; mild diastolic dysfunction.  History of Present Illness     58 year old male with a history of hypertension and otherwise no prior cardiac history, who presented to the emergency department with left-sided chest pain and dyspnea and has been found to have an elevated troponin.  He has no prior cardiac history, though he does have a family history of CAD, with his father dying of an myocardial infarction at age 759.  He says that over the past several months, he has been having intermittent rest and exertional left-sided chest discomfort sometimes associated with left arm tingling and dyspnea, occurring about 3 times a month, lasting about an hour, and resolving spontaneously.  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   When he was at work at day of presentation, he was lifting a heavy pipe off of the truck with the help of some coworkers.  At that point, he developed more severe left-sided chest pressure associated dyspnea, and tingling in his left arm and fingers.  He stopped working and symptoms persisted, prompting him to eventually present to the emergency department.  ECG showed no acute changes while blood pressure was mildly elevated at 164/82.  Initial troponin was normal but subsequent troponin has returned at 0.09.  In that setting, he was just treated with aspirin and heparin has  been ordered.    Hospital Course     Consultants: None  He was admitted and started on heparin. His follow up troponin remained negative. Cardiac cath showed 50% mLAD, 60% ost 1st Mar and 30% pRCA. He underwent successful FFR guided mid LAD PCI and drug-eluting stenting of an "intermediate lesion" with an FFR 0.79. plan to treat ostial first obtuse marginal medically. No  complication. No recurrent chest pain. Ambulated well. Echo showed preserved LV function with mild diastolic dysfunction. On DAPT with ASA and Brillinta. BB and statin added to home regimen.   The patient been seen by Dr. Eldridge Dace today and deemed ready for discharge home. All follow-up appointments have been scheduled. Discharge medications are listed below.   Discharge Vitals Blood pressure 127/83, pulse 79, temperature 97.7 F (36.5 C), temperature source Oral, resp. rate 20, height 5\' 7"  (1.702 m), weight 77.2 kg, SpO2 99 %.  Filed Weights   10/04/18 1900 10/05/18 0200 10/06/18 0240  Weight: 79.4 kg 79.7 kg 77.2 kg   Physical Exam  Constitutional: He is oriented to person, place, and time and well-developed, well-nourished, and in no distress.  HENT:  Head: Normocephalic and atraumatic.  Eyes: Pupils are equal, round, and reactive to light. Conjunctivae are normal.  Neck: Normal range of motion. Neck supple.  Cardiovascular: Normal rate and regular rhythm.  Right radial cath site without hematoma  Pulmonary/Chest: Effort normal and breath sounds normal.  Abdominal: Soft. Bowel sounds are normal.  Musculoskeletal: Normal range of motion.  Neurological: He is alert and oriented to person, place, and time.  Skin: Skin is warm and dry.  Psychiatric: Affect normal.    Labs & Radiologic Studies    CBC Recent Labs    10/05/18 0252 10/06/18 0446  WBC 7.6 7.2  HGB 14.6 14.9  HCT 40.5 43.0  MCV 91.8 91.7  PLT 174 188   Basic Metabolic Panel Recent Labs    16/10/96 0711 10/06/18 0446  NA 140 138  K 4.4 3.9  CL 106 105  CO2 17* 24  GLUCOSE 96 103*  BUN 11 11  CREATININE 0.91 0.80  CALCIUM 8.8* 9.0   Cardiac Enzymes Recent Labs    10/04/18 2144 10/05/18 0252 10/05/18 1013  TROPONINI <0.03 <0.03 <0.03   Hemoglobin A1C Recent Labs    10/04/18 2144  HGBA1C 6.0*   Fasting Lipid Panel Recent Labs    10/05/18 0252  CHOL 181  HDL 50  LDLCALC 98  TRIG 167*    CHOLHDL 3.6   _____________  Dg Chest 2 View  Result Date: 10/04/2018 CLINICAL DATA:  Chest pain and tingling in the finger tips. EXAM: CHEST - 2 VIEW COMPARISON:  04/26/2018 FINDINGS: The heart size and mediastinal contours are within normal limits. Both lungs are clear. The visualized skeletal structures are unremarkable. IMPRESSION: No active cardiopulmonary disease. Electronically Signed   By: Tollie Eth M.D.   On: 10/04/2018 14:30   Disposition   Pt is being discharged home today in good condition.  Follow-up Plans & Appointments    Follow-up Information    [facility] Follow up on 11/02/2018.   Why:  9:30 , for hospital follow up apt, you will be seeing Dr. Rodena Medin Contact information: [address] [place] [id number] [phone]       Parke Poisson, MD. Go on 10/16/2018.   Specialty:  Cardiology Why:  9:40am for hospital follow up  Contact information: [address]  [place] [address] [phone]          Discharge Instructions    AMB Referral to Cardiac Rehabilitation - Phase II   Complete by:  As directed    Diagnosis:  NSTEMI   Diet - low sodium heart healthy   Complete by:  As directed    Discharge instructions   Complete by:  As directed    NO HEAVY LIFTING (>10lbs) X 10 days  NO SEXUAL ACTIVITY X 2 WEEKS. NO DRIVING X 5 DAYS  NO SOAKING BATHS, HOT TUBS, POOLS, ETC., X 7 DAYS. You can take shower.   Increase activity slowly   Complete by:  As directed       Discharge Medications   Allergies as of 10/06/2018   No Known Allergies     Medication List    TAKE these medications   acetaminophen 500 MG tablet Commonly known as:  TYLENOL Take 500-1,000 mg by mouth every 6 (six) hours as needed (for pain or headaches).   aspirin EC 81 MG tablet Take 1 tablet (81 mg total) by mouth daily.   atorvastatin 80 MG tablet Commonly known as:  LIPITOR Take 1 tablet (80 mg  total) by mouth daily at 6 PM.   FISH OIL PO Take 1 capsule by mouth daily.   ibuprofen 200 MG tablet Commonly known as:  ADVIL,MOTRIN Take 400 mg by mouth every 6 (six) hours as needed (for pain).   losartan 100 MG tablet Commonly known as:  COZAAR Take 100 mg by mouth daily.   metoprolol tartrate 25 MG tablet Commonly known as:  LOPRESSOR Take 1 tablet (25 mg total) by mouth 2 (two) times daily. What changed:    medication strength  how much to take  when to take this   nitroGLYCERIN 0.4 MG SL tablet Commonly known as:  NITROSTAT Place 1 tablet (0.4 mg total) under the tongue every 5 (five) minutes x 3 doses as needed for chest pain.   ticagrelor 90 MG Tabs tablet Commonly known as:  BRILINTA Take 1 tablet (90 mg total) by mouth 2 (two) times daily.   ZANTAC 75 75 MG tablet Generic drug:  ranitidine Take 75 mg by mouth daily before breakfast.        Acute coronary syndrome (MI, NSTEMI, STEMI, etc) this admission?: Yes.     AHA/ACC Clinical Performance & Quality Measures: 1. Aspirin prescribed? - Yes 2. ADP Receptor Inhibitor (Plavix/Clopidogrel, Brilinta/Ticagrelor or Effient/Prasugrel) prescribed (includes medically managed patients)? - Yes 3. Beta Blocker prescribed? - Yes 4. High Intensity Statin (Lipitor 40-80mg  or Crestor 20-40mg ) prescribed? - Yes 5. EF assessed during THIS hospitalization? - Yes 6. For EF <40%, was ACEI/ARB prescribed? - Yes 7. For EF <40%, Aldosterone Antagonist (Spironolactone or Eplerenone) prescribed? - Not Applicable (EF >/= 40%) 8. Cardiac Rehab Phase II ordered (Included Medically managed Patients)? - Yes     Outstanding Labs/Studies   Consider OP f/u labs 6-8 weeks given statin initiation this admission.  Duration of Discharge Encounter   Greater than 30 minutes including physician time.  SignedSharrell Ku [place], PA 10/06/2018, 8:06 AM  I have examined the patient and reviewed assessment and plan and discussed with  patient.  Agree with above as stated.    GEN: Well nourished, well developed, in no acute distress  HEENT: normal  Neck: no JVD, carotid bruits, or masses Cardiac: RRR; no murmurs, rubs, or gallops,no edema  Respiratory:  clear to auscultation bilaterally, normal work of breathing GI: soft, nontender, nondistended,  MS: no deformity or atrophy , no right wrist hematoma Skin: warm and dry, no rash Neuro:  Strength and sensation are intact Psych: euthymic mood, full affect   Doing well.  No angina with walking.  Stressed importance of DAPT.  CONtinue aggressive secondary prevention.   Lance Muss

## 2018-10-06 NOTE — Progress Notes (Signed)
CARDIAC REHAB PHASE I   PRE:  Rate/Rhythm: 78 SR  BP:  Sitting: 134/74      SaO2: 96 RA  MODE:  Ambulation: 1000 ft 106 peak HR  POST:  Rate/Rhythm: 81 SR  BP:  Sitting: 140/84    SaO2: 99 RA   Pt ambulated 1074ft in hallway independently with fast steady gait. Pt denies CP, but c/o slight SOB upon return to room. Pt and wife edcuated on importance of ASA, Brilinta, and NTG. Stent card and MI book at bedside. Pt given heart healthy diet. Reviewed restrictions and exercise guidelines. Will refer to CRP II [place].  [id number] Reynold Bowen, RN BSN 10/06/2018 8:54 AM

## 2018-10-10 NOTE — Progress Notes (Signed)
Transitions of Care Follow Up Call Note  Andrew Hunter is an 58 y.o. male who presented to [facility] on 10/04/2018.  The patient had the following prescriptions filled at [facility]: metoprolol, nitroglycerin, Brilinta, aspirin, atorvastatin  Patient was called by pharmacist and HIPAA identifiers were verified. The following questions were asked about the prescriptions filled at [facility]:  Has the patient been experiencing any side effects to the medications prescribed? yes Understanding of regimen: good Understanding of indications: good Potential of compliance: good   [x]  Patient's prescriptions filled at the [facility] were transferred to the following pharmacy: metoprolol, nitroglycerin, Brilinta, aspirin, atorvastatin []  Patient unable to be reached after calling three times and prescriptions filled at the [facility] were transferred to preferred pharmacy found within their chart.  Pharmacist Comments: Patient states that he has been feeling short of breath since starting his medications. This can likely be due to the new start Ticagrelor. Next appointment with Cardiologist on 2/24. Will route note to Cardiology and PCP.    Barton Fanny Tieara Flitton 10/10/2018, 5:18 PM Transitions of Care Pharmacy Hours: Monday - Friday 8:30am to 5:00 PM  Phone - [phone]

## 2018-10-12 ENCOUNTER — Telehealth (HOSPITAL_COMMUNITY): Payer: Self-pay

## 2018-10-12 NOTE — Telephone Encounter (Signed)
Called and spoke with pt in regards to medicaid potential pt stated that he is still waiting to hear from [facility] and does not have coverage at this time.. did offer CR maintenance pt stated that he would possibly be interested in that the he would give a call back at a later time. Will follow up with pt at another date. Pass to RN navigator for review.  Tempie Donning. Support Rep II

## 2018-10-16 ENCOUNTER — Ambulatory Visit (INDEPENDENT_AMBULATORY_CARE_PROVIDER_SITE_OTHER): Payer: Self-pay | Admitting: Internal Medicine

## 2018-10-16 ENCOUNTER — Encounter: Payer: Self-pay | Admitting: Internal Medicine

## 2018-10-16 VITALS — BP 134/74 | HR 70 | Ht 67.0 in | Wt 178.4 lb

## 2018-10-16 DIAGNOSIS — I214 Non-ST elevation (NSTEMI) myocardial infarction: Secondary | ICD-10-CM

## 2018-10-16 DIAGNOSIS — E781 Pure hyperglyceridemia: Secondary | ICD-10-CM

## 2018-10-16 DIAGNOSIS — I10 Essential (primary) hypertension: Secondary | ICD-10-CM

## 2018-10-16 LAB — HEPATIC FUNCTION PANEL
ALT: 38 IU/L (ref 0–44)
AST: 16 IU/L (ref 0–40)
Albumin: 4.4 g/dL (ref 3.8–4.9)
Alkaline Phosphatase: 108 IU/L (ref 39–117)
Bilirubin Total: 1.4 mg/dL — ABNORMAL HIGH (ref 0.0–1.2)
Bilirubin, Direct: 0.38 mg/dL (ref 0.00–0.40)
Total Protein: 6.9 g/dL (ref 6.0–8.5)

## 2018-10-16 MED ORDER — LOSARTAN POTASSIUM 50 MG PO TABS: 50.0000 mg | ORAL_TABLET | Freq: Two times a day (BID) | ORAL | Status: DC

## 2018-10-16 NOTE — Progress Notes (Signed)
Cardiology Office Note:    Date:  10/16/2018   ID:  Andrew Hunter, DOB Aug 23, 1961, MRN [id number]  PCP:  Patient, No Pcp Per  Cardiologist:  Parke Poisson, MD  Electrophysiologist:  None   Referring MD: No ref. provider found   Hospital follow up; s/p mid LAD PCI for NSTEMI  History of Present Illness:    Andrew Hunter is a 58 y.o. male with a hx of an STEMI recently hospitalized status post FFR guided mid LAD PCI with drug-eluting stent.  He also has a 60 to 70% ostial first obtuse marginal branch stenosis that was deemed best for medical therapy.  LV function was normal.  He was initiated on dual antiplatelet therapy with aspirin and Brilinta for 1 year uninterrupted.  Overall he has been well since his hospital discharge and has return to work in a limited capacity.  He works with sheet metal and has had no significant exertional symptoms.  On Friday and Saturday evening he noted that he had to take 1 nitroglycerin for chest discomfort and left arm pain which relieved his symptoms.  He is also noted some shortness of breath but this has been improving since his hospital stay and he also notes dizziness but feels that this is related to a dietary supplement he was taking from [employer] to treat prediabetes.  He does not know exactly what was in the prediabetic supplement but notes it was 6 or 7 vitamins.  He also recently had an increase in his dose of losartan 100 mg daily and is curious as to whether this may be too much medicine for him.  His medical therapy in the setting of coronary artery disease is aspirin 81 mg daily, atorvastatin 80 mg daily, losartan 100 mg daily, metoprolol tartrate 25 mg twice daily, as needed sublingual nitroglycerin, and Brilinta 90 mg twice daily.  The patient denies palpitations, PND, orthopnea, or leg swelling. Denies syncope or presyncope.  Past Medical History:  Diagnosis Date  . Coronary artery disease   . Essential hypertension     Past  Surgical History:  Procedure Laterality Date  . CORONARY STENT INTERVENTION N/A 10/05/2018   Procedure: CORONARY STENT INTERVENTION;  Surgeon: Runell Gess, MD;  Location: [facility];  Service: Cardiovascular;  Laterality: N/A;  MID LAD  . INTRAVASCULAR PRESSURE WIRE/FFR STUDY N/A 10/05/2018   Procedure: INTRAVASCULAR PRESSURE WIRE/FFR STUDY;  Surgeon: Runell Gess, MD;  Location: [facility] CV LAB;  Service: Cardiovascular;  Laterality: N/A;  FFR LAD  . LEFT HEART CATH AND CORONARY ANGIOGRAPHY N/A 10/05/2018   Procedure: LEFT HEART CATH AND CORONARY ANGIOGRAPHY;  Surgeon: Runell Gess, MD;  Location: [facility] CV LAB;  Service: Cardiovascular;  Laterality: N/A;    Current Medications: Current Meds  Medication Sig  . acetaminophen (TYLENOL) 500 MG tablet Take 500-1,000 mg by mouth every 6 (six) hours as needed (for pain or headaches).  Marland Kitchen aspirin EC 81 MG tablet Take 1 tablet (81 mg total) by mouth daily.  Marland Kitchen atorvastatin (LIPITOR) 80 MG tablet Take 1 tablet (80 mg total) by mouth daily at 6 PM.  . calcium carbonate (TUMS - DOSED IN MG ELEMENTAL CALCIUM) 500 MG chewable tablet Chew 1 tablet by mouth daily as needed for indigestion or heartburn.  Marland Kitchen ibuprofen (ADVIL,MOTRIN) 200 MG tablet Take 400 mg by mouth every 6 (six) hours as needed (for pain).  Marland Kitchen losartan (COZAAR) 50 MG tablet Take 1 tablet (50 mg total) by mouth 2 (  two) times daily.  . metoprolol tartrate (LOPRESSOR) 25 MG tablet Take 1 tablet (25 mg total) by mouth 2 (two) times daily.  . Multiple Vitamin (MULTIVITAMIN) capsule Take 1 capsule by mouth daily.  . nitroGLYCERIN (NITROSTAT) 0.4 MG SL tablet Place 1 tablet (0.4 mg total) under the tongue every 5 (five) minutes x 3 doses as needed for chest pain.  . Omega-3 Fatty Acids (FISH OIL PO) Take 1 capsule by mouth daily.  . ticagrelor (BRILINTA) 90 MG TABS tablet Take 1 tablet (90 mg total) by mouth 2 (two) times daily.  . [DISCONTINUED] losartan (COZAAR) 100 MG  tablet Take 100 mg by mouth daily.     Allergies:   Patient has no known allergies.   Social History   Socioeconomic History  . Marital status: Single    Spouse name: Not on file  . Number of children: Not on file  . Years of education: Not on file  . Highest education level: Not on file  Occupational History  . Not on file  Social Needs  . Financial resource strain: Not on file  . Food insecurity:    Worry: Not on file    Inability: Not on file  . Transportation needs:    Medical: Not on file    Non-medical: Not on file  Tobacco Use  . Smoking status: Never Smoker  . Smokeless tobacco: Never Used  Substance and Sexual Activity  . Alcohol use: Yes    Comment: 2 beers/month  . Drug use: Never  . Sexual activity: Not on file  Lifestyle  . Physical activity:    Days per week: Not on file    Minutes per session: Not on file  . Stress: Not on file  Relationships  . Social connections:    Talks on phone: Not on file    Gets together: Not on file    Attends religious service: Not on file    Active member of club or organization: Not on file    Attends meetings of clubs or organizations: Not on file    Relationship status: Not on file  Other Topics Concern  . Not on file  Social History Narrative   Lives locally with wife.  Does not routinely exercise.  Works in Therapist, sports.     Family History: The patient's family history includes Cancer in his mother; Heart attack in his father.  ROS:   Please see the history of present illness.    All other systems reviewed and are negative.  EKGs/Labs/Other Studies Reviewed:    The following studies were reviewed today:  EKG: Normal sinus rhythm with incomplete right bundle branch block QRS duration 110 ms.  Recent Labs: 10/06/2018: BUN 11; Creatinine, Ser 0.80; Hemoglobin 14.9; Platelets 188; Potassium 3.9; Sodium 138  Recent Lipid Panel    Component Value Date/Time   CHOL 181 10/05/2018 0252   TRIG 167 (H) 10/05/2018  0252   HDL 50 10/05/2018 0252   CHOLHDL 3.6 10/05/2018 0252   VLDL 33 10/05/2018 0252   LDLCALC 98 10/05/2018 0252    Physical Exam:    VS:  BP 134/74   Pulse 70   Ht  (1.702 m)   Wt 178 lb 6.4 oz (80.9 kg)   SpO2 98%   BMI 27.94 kg/m     Wt Readings from Last 3 Encounters:  10/16/18 178 lb 6.4 oz (80.9 kg)  10/06/18 170 lb 3.1 oz (77.2 kg)     Constitutional: No acute distress  Eyes: pupils equally round and reactive to light, sclera non-icteric, normal conjunctiva and lids ENMT: normal dentition, moist mucous membranes Cardiovascular: regular rhythm, normal rate, no murmurs. S1 and S2 normal. Radial pulses normal bilaterally. No jugular venous distention.  Respiratory: clear to auscultation bilaterally GI : normal bowel sounds, soft and nontender. No distention.   MSK: extremities warm, well perfused. No edema.  NEURO: grossly nonfocal exam, moves all extremities. PSYCH: alert and oriented x 3, normal mood and affect.   ASSESSMENT:    1. NSTEMI (non-ST elevated myocardial infarction) (HCC)   2. Hypertriglyceridemia   3. Essential hypertension    PLAN:   He is overall doing well has had minimal discomfort in his chest.  For chest pain he is taking sublingual nitroglycerin.  I have informed him that he should monitor how often he is having to take sublingual nitro for fleeting chest pain.  If he continues to have chest pain we will start a low-dose of Imdur to monitor for symptom resolution.  He does have residual disease that will be best treated medically.  Hypertriglyceridemia-he should continue diet and lifestyle modification as well as statin therapy.  Hypertension- he was taking losartan and the dose was increased 100 mg daily.  He feels that this may be contributing to his dizziness.  His blood pressures still above goal at 134/74 mmHg.  I encouraged him to split the dose of losartan and take it with his metoprolol to see if this lessens his dizziness.  If not we  will decrease the dose of losartan or consider alternate therapy.  He is not experiencing bradycardia or signs of heart block with metoprolol.  N STEMI- he should continue on dual antiplatelet therapy for 1 year without interruption.  Nausea- he is having some nausea after starting high intensity statin therapy.  He recalls having a similar sensation when trying Lipitor in the past.  He is not experiencing myalgias.  We will check LFTs to ensure he is not having hepatic dysfunction with statin therapy, if this is normal we will monitor symptoms and reassess in 1 month.  I have instructed the patient that dual antiplatelet therapy should be taken for 1 year without interruption.  We have discussed the consequences of interrupted dual antiplatelet therapy and the risk for in-stent thrombosis.   Medication Adjustments/Labs and Tests Ordered: Current medicines are reviewed at length with the patient today.  Concerns regarding medicines are outlined above.  Orders Placed This Encounter  Procedures  . Hepatic function panel  . EKG 12-Lead   Meds ordered this encounter  Medications  . losartan (COZAAR) 50 MG tablet    Sig: Take 1 tablet (50 mg total) by mouth 2 (two) times daily.    Patient Instructions  Medication Instructions:  Dr.Latrice Storlie has recommended you make the following change in your medication:   CHANGE how you take Losartan.  TAKE 1/2 tab (50mg ) twice daily.  If you need a refill on your cardiac medications before your next appointment, please call your pharmacy.   Lab work: Hepatic panel today If you have labs (blood work) drawn today and your tests are completely normal, you will receive your results only by: Marland Kitchen MyChart Message (if you have MyChart) OR . A paper copy in the mail If you have any lab test that is abnormal or we need to change your treatment, we will call you to review the results.  Testing/Procedures: None ordered  Follow-Up: At [facility], you and  your health needs  are our priority.  As part of our continuing mission to provide you with exceptional heart care, we have created designated Provider Care Teams.  These Care Teams include your primary Cardiologist (physician) and Advanced Practice Providers (APPs -  Physician Assistants and Nurse Practitioners) who all work together to provide you with the care you need, when you need it. You will need a follow up appointment in 4 weeks.You may see Parke Poisson, MD or one of the following Advanced Practice Providers on your designated Care Team:   Theodore Demark, PA-C . Joni Reining, DNP, ANP        Signed, Parke Poisson, MD  10/16/2018 1:08 PM    [facility]

## 2018-10-16 NOTE — Patient Instructions (Signed)
Medication Instructions:  Dr.Acharya has recommended you make the following change in your medication:   CHANGE how you take Losartan.  TAKE 1/2 tab (50mg ) twice daily.  If you need a refill on your cardiac medications before your next appointment, please call your pharmacy.   Lab work: Hepatic panel today If you have labs (blood work) drawn today and your tests are completely normal, you will receive your results only by: Marland Kitchen MyChart Message (if you have MyChart) OR . A paper copy in the mail If you have any lab test that is abnormal or we need to change your treatment, we will call you to review the results.  Testing/Procedures: None ordered  Follow-Up: At [facility], you and your health needs are our priority.  As part of our continuing mission to provide you with exceptional heart care, we have created designated Provider Care Teams.  These Care Teams include your primary Cardiologist (physician) and Advanced Practice Providers (APPs -  Physician Assistants and Nurse Practitioners) who all work together to provide you with the care you need, when you need it. You will need a follow up appointment in 4 weeks.You may see Parke Poisson, MD or one of the following Advanced Practice Providers on your designated Care Team:   Theodore Demark, PA-C . Joni Reining, DNP, ANP

## 2018-10-24 NOTE — Telephone Encounter (Signed)
Called and spoke with pt in regards to CR, pt stated he still does not have insurance. Adv pt of our Cr maintenance program, pass information over to CR maintenance coord.  Closed referral

## 2018-11-02 ENCOUNTER — Other Ambulatory Visit: Payer: Self-pay

## 2018-11-02 ENCOUNTER — Encounter: Payer: Self-pay | Admitting: Internal Medicine

## 2018-11-02 ENCOUNTER — Ambulatory Visit: Payer: Self-pay | Attending: Internal Medicine | Admitting: Internal Medicine

## 2018-11-02 VITALS — BP 130/78 | HR 69 | Temp 98.5°F | Resp 16 | Ht 67.0 in | Wt 174.6 lb

## 2018-11-02 DIAGNOSIS — I25118 Atherosclerotic heart disease of native coronary artery with other forms of angina pectoris: Secondary | ICD-10-CM

## 2018-11-02 DIAGNOSIS — K219 Gastro-esophageal reflux disease without esophagitis: Secondary | ICD-10-CM

## 2018-11-02 DIAGNOSIS — I10 Essential (primary) hypertension: Secondary | ICD-10-CM | POA: Insufficient documentation

## 2018-11-02 MED ORDER — ASPIRIN EC 81 MG PO TBEC: 81.0000 mg | DELAYED_RELEASE_TABLET | Freq: Every day | ORAL | 3 refills | Status: AC

## 2018-11-02 MED ORDER — TICAGRELOR 90 MG PO TABS: 90.0000 mg | ORAL_TABLET | Freq: Two times a day (BID) | ORAL | 11 refills | Status: DC

## 2018-11-02 MED ORDER — METOPROLOL TARTRATE 25 MG PO TABS: 37.5000 mg | ORAL_TABLET | Freq: Two times a day (BID) | ORAL | 11 refills | Status: DC

## 2018-11-02 MED ORDER — ATORVASTATIN CALCIUM 80 MG PO TABS: 80.0000 mg | ORAL_TABLET | Freq: Every day | ORAL | 11 refills | Status: DC

## 2018-11-02 MED ORDER — LOSARTAN POTASSIUM 25 MG PO TABS: 25.0000 mg | ORAL_TABLET | Freq: Every day | ORAL | 1 refills | Status: DC

## 2018-11-02 NOTE — Progress Notes (Signed)
Patient ID: Andrew Hunter, male    DOB: Jun 08, 1961  MRN: [id number]  CC: Hospitalization Follow-up   Subjective: Andrew Hunter is a 58 y.o. male who presents for new patient visit/hospital follow-up.  Patient speaks [place]. His concerns today include:  Patient with history of HTN, CAD, GERD  Previous PCP was at [facility].  Last seen by them 06/2018.  Decided to change providers because he is uninsured.  Patient hospitalized 2/12-14/2020 with an STEMI.  Found to have 50% proximal LAD lesion, 60 to 70% first obtuse marginal branch stenosis and proximal RCA 30% stenosis.  Drug-eluting stent placed to the LAD lesion.  EF 60 to 65%.  Patient discharged on metoprolol, Lipitor, aspirin, Brilinta and Cozaar.  He has seen the cardiologist Dr. Jacques Navy in follow-up since hospital.  Reports that the dose of Cozaar was decreased from 100 mg daily to 50 mg daily. -He reports compliance with medication.  He is unable to afford cardiac rehab and is here today to get applications for the orange card/cone discount card. -He has had intermittent chest pains since discharge.  Yesterday he had to take 1 sublingual nitroglycerin in the morning and then later in the day.  Complains of intermittent GERD symptoms for which he was on Zantac.  However he discontinued taking the Zantac due to recent reports of possible carcinogen.  Symptoms worse with certain foods like cranberry juice, chili  Past medical history, surgical history, social history and family histories reviewed. Patient Active Problem List   Diagnosis Date Noted  . NSTEMI (non-ST elevated myocardial infarction) (HCC) 10/04/2018     Current Outpatient Medications on File Prior to Visit  Medication Sig Dispense Refill  . acetaminophen (TYLENOL) 500 MG tablet Take 500-1,000 mg by mouth every 6 (six) hours as needed (for pain or headaches).    Marland Kitchen aspirin EC 81 MG tablet Take 1 tablet (81 mg total) by mouth daily. 90 tablet 3  .  atorvastatin (LIPITOR) 80 MG tablet Take 1 tablet (80 mg total) by mouth daily at 6 PM. 30 tablet 11  . calcium carbonate (TUMS - DOSED IN MG ELEMENTAL CALCIUM) 500 MG chewable tablet Chew 1 tablet by mouth daily as needed for indigestion or heartburn.    Marland Kitchen ibuprofen (ADVIL,MOTRIN) 200 MG tablet Take 400 mg by mouth every 6 (six) hours as needed (for pain).    Marland Kitchen losartan (COZAAR) 50 MG tablet Take 1 tablet (50 mg total) by mouth 2 (two) times daily.    . metoprolol tartrate (LOPRESSOR) 25 MG tablet Take 1 tablet (25 mg total) by mouth 2 (two) times daily. 60 tablet 11  . Multiple Vitamin (MULTIVITAMIN) capsule Take 1 capsule by mouth daily.    . nitroGLYCERIN (NITROSTAT) 0.4 MG SL tablet Place 1 tablet (0.4 mg total) under the tongue every 5 (five) minutes x 3 doses as needed for chest pain. 25 tablet 1  . Omega-3 Fatty Acids (FISH OIL PO) Take 1 capsule by mouth daily.    . ticagrelor (BRILINTA) 90 MG TABS tablet Take 1 tablet (90 mg total) by mouth 2 (two) times daily. 60 tablet 11   No current facility-administered medications on file prior to visit.     No Known Allergies  Social History   Socioeconomic History  . Marital status: Single    Spouse name: Not on file  . Number of children: Not on file  . Years of education: Not on file  . Highest education level: Not on file  Occupational  History  . Not on file  Social Needs  . Financial resource strain: Not on file  . Food insecurity:    Worry: Not on file    Inability: Not on file  . Transportation needs:    Medical: Not on file    Non-medical: Not on file  Tobacco Use  . Smoking status: Never Smoker  . Smokeless tobacco: Never Used  Substance and Sexual Activity  . Alcohol use: Yes    Comment: 2 beers/month  . Drug use: Never  . Sexual activity: Not on file  Lifestyle  . Physical activity:    Days per week: Not on file    Minutes per session: Not on file  . Stress: Not on file  Relationships  . Social connections:     Talks on phone: Not on file    Gets together: Not on file    Attends religious service: Not on file    Active member of club or organization: Not on file    Attends meetings of clubs or organizations: Not on file    Relationship status: Not on file  . Intimate partner violence:    Fear of current or ex partner: Not on file    Emotionally abused: Not on file    Physically abused: Not on file    Forced sexual activity: Not on file  Other Topics Concern  . Not on file  Social History Narrative   Lives locally with wife.  Does not routinely exercise.  Works in Therapist, sports.    Family History  Problem Relation Age of Onset  . Cancer Mother        liver  . Heart attack Father        died @ 51    Past Surgical History:  Procedure Laterality Date  . CORONARY STENT INTERVENTION N/A 10/05/2018   Procedure: CORONARY STENT INTERVENTION;  Surgeon: Runell Gess, MD;  [facility]: [facility];  Service: Cardiovascular;  Laterality: N/A;  MID LAD  . INTRAVASCULAR PRESSURE WIRE/FFR STUDY N/A 10/05/2018   Procedure: INTRAVASCULAR PRESSURE WIRE/FFR STUDY;  Surgeon: Runell Gess, MD;  Location: [facility];  Service: Cardiovascular;  Laterality: N/A;  FFR LAD  . LEFT HEART CATH AND CORONARY ANGIOGRAPHY N/A 10/05/2018   Procedure: LEFT HEART CATH AND CORONARY ANGIOGRAPHY;  Surgeon: Runell Gess, MD;  [facility]: [facility];  Service: Cardiovascular;  Laterality: N/A;    ROS: Review of Systems Negative except as stated above  PHYSICAL EXAM: BP 130/78   Pulse 69   Temp 98.5 F (36.9 C) (Oral)   Resp 16   Ht 5\' 7"  (1.702 m)   Wt 174 lb 9.6 oz (79.2 kg)   SpO2 98%   BMI 27.35 kg/m   Physical Exam  General appearance - alert, well appearing, and in no distress Mental status - normal mood, behavior, speech, dress, motor activity, and thought processes Eyes - pupils equal and reactive, extraocular eye movements intact Mouth - mucous membranes moist, pharynx  normal without lesions Neck - supple, no significant adenopathy Chest - clear to auscultation, no wheezes, rales or rhonchi, symmetric air entry Heart - normal rate, regular rhythm, normal S1, S2, no murmurs, rubs, clicks or gallops Extremities - peripheral pulses normal, no pedal edema, no clubbing or cyanosis  CMP Latest Ref Rng & Units 10/16/2018 10/06/2018 10/05/2018  Glucose 70 - 99 mg/dL - 211(H) 96  BUN 6 - 20 mg/dL - 11 11  Creatinine 4.17 -  1.24 mg/dL - 8.89 1.69  Sodium 450 - 145 mmol/L - 138 140  Potassium 3.5 - 5.1 mmol/L - 3.9 4.4  Chloride 98 - 111 mmol/L - 105 106  CO2 22 - 32 mmol/L - 24 17(L)  Calcium 8.9 - 10.3 mg/dL - 9.0 3.8(U)  Total Protein 6.0 - 8.5 g/dL 6.9 - -  Total Bilirubin 0.0 - 1.2 mg/dL 8.2(C) - -  Alkaline Phos 39 - 117 IU/L 108 - -  AST 0 - 40 IU/L 16 - -  ALT 0 - 44 IU/L 38 - -   Lipid Panel     Component Value Date/Time   CHOL 181 10/05/2018 0252   TRIG 167 (H) 10/05/2018 0252   HDL 50 10/05/2018 0252   CHOLHDL 3.6 10/05/2018 0252   VLDL 33 10/05/2018 0252   LDLCALC 98 10/05/2018 0252    CBC    Component Value Date/Time   WBC 7.2 10/06/2018 0446   RBC 4.69 10/06/2018 0446   HGB 14.9 10/06/2018 0446   HGB 16.2 12/08/2009 1448   HCT 43.0 10/06/2018 0446   HCT 46.2 12/08/2009 1448   PLT 188 10/06/2018 0446   PLT 191 12/08/2009 1448   MCV 91.7 10/06/2018 0446   MCV 95.3 12/08/2009 1448   MCH 31.8 10/06/2018 0446   MCHC 34.7 10/06/2018 0446   RDW 12.3 10/06/2018 0446   RDW 13.2 12/08/2009 1448   LYMPHSABS 2.3 04/26/2018 2333   LYMPHSABS 2.2 12/08/2009 1448   MONOABS 0.6 04/26/2018 2333   MONOABS 0.3 12/08/2009 1448   EOSABS 0.1 04/26/2018 2333   EOSABS 0.0 12/08/2009 1448   BASOSABS 0.0 04/26/2018 2333   BASOSABS 0.0 12/08/2009 1448    ASSESSMENT AND PLAN:  1. Coronary artery disease of native artery of native heart with stable angina pectoris (HCC) 2. Essential hypertension Given that he is still having some episodes of  angina, I recommend increasing the metoprolol to 25 mg 1-1/2 tablet twice a day.  We will have him decrease the Cozaar to 25 mg daily.  He will keep his follow-up appointment with the cardiologist later this month.  3. Gastroesophageal reflux disease without esophagitis GERD precautions discussed including foods to avoid.  Ibuprofen is on med list but he states that he is not taking it.  Advised to avoid NSAIDs.  We will put him on omeprazole to use as needed.    Patient was given the opportunity to ask questions.  Patient verbalized understanding of the plan and was able to repeat key elements of the plan.   No orders of the defined types were placed in this encounter.    Requested Prescriptions    No prescriptions requested or ordered in this encounter    Return in about 3 months (around 02/02/2019).  Jonah Blue, MD, FACP

## 2018-11-03 MED FILL — ATORVASTATIN 80 MG TABLET: 80 | 30 days supply | Qty: 30 | Fill #0

## 2018-11-03 MED FILL — BRILINTA 90 MG TABLET: 90 | 30 days supply | Qty: 60 | Fill #0

## 2018-11-03 MED FILL — LOSARTAN POTASSIUM 25 MG TA: 25 | 30 days supply | Qty: 30 | Fill #0

## 2018-11-03 MED FILL — METOPROLOL TARTRATE 25 MG T: 25 | 30 days supply | Qty: 90 | Fill #0

## 2018-11-07 ENCOUNTER — Telehealth: Payer: Self-pay

## 2018-11-07 NOTE — Telephone Encounter (Signed)
Contacted patient to reschedule his appointment if he was feeling better and had no concerns that needed to be addressed as soon as possible.  Spoke with patient and he expressed that he was feeling better and agreed for his appointment to be rescheduled.

## 2018-11-09 ENCOUNTER — Telehealth: Payer: Self-pay

## 2018-11-09 NOTE — Telephone Encounter (Signed)
-----   Message from Parke Poisson, MD sent at 11/09/2018 12:45 PM EDT ----- Minimal elevation in total bilirubin. Will monitor symptoms. No change in therapy required at this time, with otherwise normal LFTs. Can be revisited by PCP as needed.

## 2018-11-09 NOTE — Telephone Encounter (Signed)
Called to give pt lab results.lmtcb  

## 2018-11-13 ENCOUNTER — Ambulatory Visit: Payer: Self-pay | Admitting: Internal Medicine

## 2018-11-24 MED FILL — METOPROLOL TARTRATE 25 MG T: 25 | 30 days supply | Qty: 90 | Fill #1

## 2018-11-30 NOTE — Telephone Encounter (Signed)
Please offer this patient a video follow up with me the week of 4/20 or the week of 5/11. Thanks! GA 

## 2018-12-01 MED FILL — BRILINTA 90 MG TABLET: 90 | 30 days supply | Qty: 60 | Fill #1

## 2018-12-01 MED FILL — LOSARTAN POTASSIUM 25 MG TA: 25 | 30 days supply | Qty: 30 | Fill #1

## 2018-12-04 ENCOUNTER — Telehealth: Payer: Self-pay | Admitting: *Deleted

## 2018-12-04 NOTE — Telephone Encounter (Signed)
PER DR Jacques Navy - WOULD LIKE TO SCHEDULE A TELE-VISIT  FOR THE WEEK OF 4/20  OR WEEK OF 5/11 . PATIENT STATES HE WILL CALL TOMORROW . HE IS DRIVING RIGHT NOW AND CANNOT TALK.

## 2018-12-05 NOTE — Telephone Encounter (Signed)
REACHED OUT TO PATIENT ON 12/04/18 TO SCHEDULE HE HAS NOT RETURN CALL

## 2018-12-05 NOTE — Telephone Encounter (Signed)
Left message to call the office back to get him a appointment scheduled.

## 2018-12-28 MED FILL — BRILINTA 90 MG TABLET: 90 | 30 days supply | Qty: 60 | Fill #2

## 2018-12-28 MED FILL — LOSARTAN POTASSIUM 25 MG TA: 25 | 30 days supply | Qty: 30 | Fill #2

## 2018-12-28 MED FILL — ?ATORVASTATIN 40MG TABLET: 40 | 30 days supply | Qty: 60 | Fill #0

## 2018-12-28 MED FILL — ?METOPROLOL 25 MG TABLET: 25 | 30 days supply | Qty: 90 | Fill #2

## 2019-01-26 MED FILL — ?METOPROLOL 25 MG TABLET: 25 | 30 days supply | Qty: 90 | Fill #3

## 2019-01-26 MED FILL — LOSARTAN POTASSIUM 25 MG TA: 25 | 30 days supply | Qty: 30 | Fill #3

## 2019-02-02 MED FILL — BRILINTA 90 MG TABLET: 90 | 30 days supply | Qty: 60 | Fill #3

## 2019-02-02 MED FILL — ?ATORVASTATIN 40MG TABLET: 40 | 30 days supply | Qty: 60 | Fill #1

## 2019-02-09 ENCOUNTER — Other Ambulatory Visit: Payer: Self-pay

## 2019-02-09 ENCOUNTER — Telehealth: Payer: Self-pay | Admitting: Internal Medicine

## 2019-02-09 ENCOUNTER — Encounter: Payer: Self-pay | Admitting: Internal Medicine

## 2019-02-09 ENCOUNTER — Ambulatory Visit: Payer: Self-pay | Attending: Internal Medicine | Admitting: Internal Medicine

## 2019-02-09 DIAGNOSIS — I10 Essential (primary) hypertension: Secondary | ICD-10-CM

## 2019-02-09 DIAGNOSIS — I25118 Atherosclerotic heart disease of native coronary artery with other forms of angina pectoris: Secondary | ICD-10-CM

## 2019-02-09 MED ORDER — LOSARTAN POTASSIUM 50 MG PO TABS: 50.0000 mg | ORAL_TABLET | Freq: Every day | ORAL | 6 refills | Status: DC

## 2019-02-09 MED ORDER — NITROGLYCERIN 0.4 MG SL SUBL: 0.4000 mg | SUBLINGUAL_TABLET | SUBLINGUAL | 1 refills | Status: DC | PRN

## 2019-02-09 NOTE — Progress Notes (Signed)
Virtual Visit via Telephone Note  I connected with Andrew Hunter on 02/09/19 at 8:40 a.m EDT by telephone and verified that I am speaking with the correct person using two identifiers.   I discussed the limitations, risks, security and privacy concerns of performing an evaluation and management service by telephone and the availability of in person appointments. I also discussed with the patient that there may be a patient responsible charge related to this service. The patient expressed understanding and agreed to proceed.   History of Present Illness: Patient with history of HTN, CAD, GERD   CAD/HTN:   Currently taking: see medication list.  He has appointment coming up with her cardiologist in a few weeks.  They had tried to reach out to him several weeks ago Med Adherence: [x]  Yes    []  No Medication side effects: []  Yes    [x]  No Adherence with salt restriction: [x]  Yes    []  No Home Monitoring BP?: [x]  Yes last check 2 wks ago.  BP was in the 147s/87 Monitoring Frequency: [x]  Yes  Q wks   Home BP results range: []  Yes    []  No SOB? [x]  Yes when he wears face mask.  Works in a factory.   Chest Pain?: [x]  Yes - has to use SL Nitro about once a wk   Leg swelling?: []  Yes    [x]  No Headaches?: []  Yes    [x]  No Dizziness? []  Yes    [x]  No Comments:  Some tingling in fingers at times   Outpatient Encounter Medications as of 02/09/2019  Medication Sig  . acetaminophen (TYLENOL) 500 MG tablet Take 500-1,000 mg by mouth every 6 (six) hours as needed (for pain or headaches).  Marland Kitchen. aspirin EC 81 MG tablet Take 1 tablet (81 mg total) by mouth daily.  Marland Kitchen. atorvastatin (LIPITOR) 80 MG tablet Take 1 tablet (80 mg total) by mouth daily at 6 PM.  . calcium carbonate (TUMS - DOSED IN MG ELEMENTAL CALCIUM) 500 MG chewable tablet Chew 1 tablet by mouth daily as needed for indigestion or heartburn.  . losartan (COZAAR) 25 MG tablet Take 1 tablet (25 mg total) by mouth daily.  . metoprolol tartrate  (LOPRESSOR) 25 MG tablet Take 1.5 tablets (37.5 mg total) by mouth 2 (two) times daily.  . Multiple Vitamin (MULTIVITAMIN) capsule Take 1 capsule by mouth daily.  . nitroGLYCERIN (NITROSTAT) 0.4 MG SL tablet Place 1 tablet (0.4 mg total) under the tongue every 5 (five) minutes x 3 doses as needed for chest pain.  . Omega-3 Fatty Acids (FISH OIL PO) Take 1 capsule by mouth daily.  . ticagrelor (BRILINTA) 90 MG TABS tablet Take 1 tablet (90 mg total) by mouth 2 (two) times daily.   No facility-administered encounter medications on file as of 02/09/2019.     Observations/Objective: No direct observation done  Assessment and Plan: 1. Coronary artery disease of native artery of native heart with stable angina pectoris (HCC) -Continue current medications including metoprolol, aspirin, Brilinta and Lipitor.  He still has some episodes of angina, but I will hold off on increasing metoprolol any further not knowing what his pulse is doing on the current dose. He will keep follow-up appointment with cardiology as scheduled. - nitroGLYCERIN (NITROSTAT) 0.4 MG SL tablet; Place 1 tablet (0.4 mg total) under the tongue every 5 (five) minutes x 3 doses as needed for chest pain.  Dispense: 25 tablet; Refill: 1 - CBC; Future - Basic metabolic panel; Future - losartan (  COZAAR) 50 MG tablet; Take 1 tablet (50 mg total) by mouth daily.  Dispense: 30 tablet; Refill: 6  2. Essential hypertension Not at goal.  Increase losartan to 50 mg daily. - losartan (COZAAR) 50 MG tablet; Take 1 tablet (50 mg total) by mouth daily.  Dispense: 30 tablet; Refill: 6   Follow Up Instructions: 3 mths   I discussed the assessment and treatment plan with the patient. The patient was provided an opportunity to ask questions and all were answered. The patient agreed with the plan and demonstrated an understanding of the instructions.   The patient was advised to call back or seek an in-person evaluation if the symptoms worsen or  if the condition fails to improve as anticipated.  I provided 13 minutes of non-face-to-face time during this encounter.   Karle Plumber, MD

## 2019-02-09 NOTE — Telephone Encounter (Signed)
smartphone/ consent/ my chart declined/ pre reg completed  °

## 2019-02-14 NOTE — Telephone Encounter (Signed)
I left a voice message reminding patient of his appointment on 02-16-19 with Dr Margaretann Loveless.

## 2019-02-15 ENCOUNTER — Telehealth: Payer: Self-pay | Admitting: Internal Medicine

## 2019-02-15 NOTE — Telephone Encounter (Signed)
Patient understands that interpreter will be calling/smartphone/ consent/ my chart declined/ pre reg completed

## 2019-02-16 ENCOUNTER — Other Ambulatory Visit: Payer: Self-pay

## 2019-02-16 ENCOUNTER — Telehealth: Payer: Self-pay | Admitting: *Deleted

## 2019-02-16 ENCOUNTER — Telehealth: Payer: Self-pay | Admitting: Internal Medicine

## 2019-02-16 ENCOUNTER — Encounter: Payer: Self-pay | Admitting: Internal Medicine

## 2019-02-16 DIAGNOSIS — I25118 Atherosclerotic heart disease of native coronary artery with other forms of angina pectoris: Secondary | ICD-10-CM

## 2019-02-16 MED ORDER — NITROGLYCERIN 0.4 MG SL SUBL: 0.4000 mg | SUBLINGUAL_TABLET | SUBLINGUAL | 1 refills | Status: DC | PRN

## 2019-02-16 NOTE — Progress Notes (Signed)
We were unable to successfully connect with the interpreter line and patient simultaneously, which was the patient's preference.   We will convert visit to an in person visit per patient preference, and patient was informed he can be scheduled next week with another MD or APP. He preferred to be seen in my next available time, we will arrange for a visit the week of July 6 when I am back in the office.   He was grateful for the call and we refilled his nitroglycerin today. We briefly discussed that he is having discomfort in his chest for which he takes nitroglycerin. I have encouraged him to make an appt sooner or go to the ED if he has worrisome symptoms which we reviewed, however he feels he is stable with no change and would prefer to wait.

## 2019-02-16 NOTE — Patient Instructions (Signed)
Medication Instructions:     If you need a refill on your cardiac medications before your next appointment, please call your pharmacy.   Lab work:    If you have labs (blood work) drawn today and your tests are completely normal, you will receive your results only by: . MyChart Message (if you have MyChart) OR . A paper copy in the mail If you have any lab test that is abnormal or we need to change your treatment, we will call you to review the results.  Testing/Procedures:   Follow-Up: At [facility], you and your health needs are our priority.  As part of our continuing mission to provide you with exceptional heart care, we have created designated Provider Care Teams.  These Care Teams include your primary Cardiologist (physician) and Advanced Practice Providers (APPs -  Physician Assistants and Nurse Practitioners) who all work together to provide you with the care you need, when you need it. . You will need a follow up appointment in                 months.  Please call our office 2 months in advance to schedule this appointment.  You may see Gayatri A Acharya, MD*or one of the following Advanced Practice Providers on your designated Care Team:   . Rhonda Barrett, PA-C . Kathryn Lawrence, DNP, ANP  Any Other Special Instructions Will Be Listed Below (If Applicable). 

## 2019-02-16 NOTE — Telephone Encounter (Signed)
UNABLE TO COMPLETE TODAY'S APPOINTMENT - TECHINICAL DIFFICULTIES- APPT RESCHEDULE TO July 10 at 9 am - in person in office. Patient aware. Aware of facing covering .      Primary Cardiologist: Elouise Munroe, MD   Pt contacted.  History and symptoms reviewed.  Pt will f/u with [facility] provider as scheduled.  Pt. advised that we are restricting visitors at this time and request that only patients present for check-in prior to their appointment.  All other visitors should remain in their car.  If necessary, only one visitor may come with the patient, into the building. For everyone's safety, all patients and visitor entering our practice area should expect to be screened again prior to entering our waiting area.  Raiford Simmonds, RN  02/16/2019 8:47 AM

## 2019-02-28 MED FILL — LOSARTAN POTASSIUM 25 MG TA: 25 | 30 days supply | Qty: 30 | Fill #4

## 2019-02-28 MED FILL — ?METOPROLOL 25 MG TABLET: 25 | 30 days supply | Qty: 90 | Fill #4

## 2019-03-01 ENCOUNTER — Telehealth: Payer: Self-pay | Admitting: Internal Medicine

## 2019-03-01 NOTE — Telephone Encounter (Signed)
LVM, reminding pt of his appt for 03-02-19 with Dr Margaretann Loveless.

## 2019-03-02 ENCOUNTER — Other Ambulatory Visit: Payer: Self-pay

## 2019-03-02 ENCOUNTER — Ambulatory Visit (INDEPENDENT_AMBULATORY_CARE_PROVIDER_SITE_OTHER): Payer: Self-pay | Admitting: Internal Medicine

## 2019-03-02 VITALS — BP 152/89 | HR 76 | Temp 98.2°F | Ht 67.0 in | Wt 178.6 lb

## 2019-03-02 DIAGNOSIS — I214 Non-ST elevation (NSTEMI) myocardial infarction: Secondary | ICD-10-CM

## 2019-03-02 DIAGNOSIS — I25118 Atherosclerotic heart disease of native coronary artery with other forms of angina pectoris: Secondary | ICD-10-CM

## 2019-03-02 MED ORDER — ISOSORBIDE MONONITRATE ER 30 MG PO TB24: 30.0000 mg | ORAL_TABLET | Freq: Every day | ORAL | 3 refills | Status: DC

## 2019-03-02 MED ORDER — LOSARTAN POTASSIUM 50 MG PO TABS: 50.0000 mg | ORAL_TABLET | Freq: Every day | ORAL | 3 refills | Status: DC

## 2019-03-02 MED FILL — BRILINTA 90 MG TABLET: 90 | 30 days supply | Qty: 60 | Fill #4

## 2019-03-02 MED FILL — ISOSORBIDE MN ER 30 MG TAB: 30 | 30 days supply | Qty: 30 | Fill #0

## 2019-03-02 NOTE — Progress Notes (Signed)
Cardiology Office Note:    Date:  03/02/2019   ID:  Pascal Stiggers, DOB 1961/01/30, MRN [id number]  PCP:  Ladell Pier, MD  Cardiologist:  Elouise Munroe, MD  Electrophysiologist:  None   Referring MD: Ladell Pier, MD   Chief Complaint: f/u NSTEMI  History of Present Illness:    Jarome Trull is a 58 y.o. male with a hx of NSTEMI recently hospitalized status post FFR guided mid LAD PCI with drug-eluting stent.  He also has a 60 to 70% ostial first obtuse marginal branch stenosis that was deemed best for medical therapy.  LV function was normal.  He was initiated on dual antiplatelet therapy with aspirin and Brilinta for 1 year uninterrupted.  Chest pain 3-4 x a week at work when it is hot and he is working hard. Takes 1 nitro and chest pain goes away.   Feels dizzy, and losartan decreased by PCP but now bp is elevated.    The patient dyspnea at rest or with exertion, palpitations, PND, orthopnea, or leg swelling. Denies syncope or presyncope.Denies snoring and has not been evaluated for sleep apnea.  Past Medical History:  Diagnosis Date  . Coronary artery disease   . Essential hypertension     Past Surgical History:  Procedure Laterality Date  . CORONARY STENT INTERVENTION N/A 10/05/2018   Procedure: CORONARY STENT INTERVENTION;  Surgeon: Lorretta Harp, MD;  Location: [facility];  Service: Cardiovascular;  Laterality: N/A;  MID LAD  . INTRAVASCULAR PRESSURE WIRE/FFR STUDY N/A 10/05/2018   Procedure: INTRAVASCULAR PRESSURE WIRE/FFR STUDY;  Surgeon: Lorretta Harp, MD;  Location: [facility];  Service: Cardiovascular;  Laterality: N/A;  FFR LAD  . LEFT HEART CATH AND CORONARY ANGIOGRAPHY N/A 10/05/2018   Procedure: LEFT HEART CATH AND CORONARY ANGIOGRAPHY;  Surgeon: Lorretta Harp, MD;  Location: [place] [facility];  Service: Cardiovascular;  Laterality: N/A;    Current Medications: Current Meds  Medication Sig  . acetaminophen (TYLENOL)  500 MG tablet Take 500-1,000 mg by mouth every 6 (six) hours as needed (for pain or headaches).  Marland Kitchen aspirin EC 81 MG tablet Take 1 tablet (81 mg total) by mouth daily.  Marland Kitchen atorvastatin (LIPITOR) 80 MG tablet Take 1 tablet (80 mg total) by mouth daily at 6 PM.  . calcium carbonate (TUMS - DOSED IN MG ELEMENTAL CALCIUM) 500 MG chewable tablet Chew 1 tablet by mouth daily as needed for indigestion or heartburn.  . metoprolol tartrate (LOPRESSOR) 25 MG tablet Take 1.5 tablets (37.5 mg total) by mouth 2 (two) times daily.  . Multiple Vitamin (MULTIVITAMIN) capsule Take 1 capsule by mouth daily.  . nitroGLYCERIN (NITROSTAT) 0.4 MG SL tablet Place 1 tablet (0.4 mg total) under the tongue every 5 (five) minutes x 3 doses as needed for chest pain.  . Omega-3 Fatty Acids (FISH OIL PO) Take 1 capsule by mouth daily.  . ticagrelor (BRILINTA) 90 MG TABS tablet Take 1 tablet (90 mg total) by mouth 2 (two) times daily.  . [DISCONTINUED] losartan (COZAAR) 25 MG tablet Take 25 mg by mouth daily.  . [DISCONTINUED] losartan (COZAAR) 50 MG tablet Take 1 tablet (50 mg total) by mouth daily.     Allergies:   Patient has no known allergies.   Social History   Socioeconomic History  . Marital status: Single    Spouse name: Not on file  . Number of children: Not on file  . Years of education: Not on file  .  Highest education level: Not on file  Occupational History  . Not on file  Social Needs  . Financial resource strain: Not on file  . Food insecurity    Worry: Not on file    Inability: Not on file  . Transportation needs    Medical: Not on file    Non-medical: Not on file  Tobacco Use  . Smoking status: Never Smoker  . Smokeless tobacco: Never Used  Substance and Sexual Activity  . Alcohol use: Yes    Comment: 2 beers/month  . Drug use: Never  . Sexual activity: Not on file  Lifestyle  . Physical activity    Days per week: Not on file    Minutes per session: Not on file  . Stress: Not on file   Relationships  . Social Musicianconnections    Talks on phone: Not on file    Gets together: Not on file    Attends religious service: Not on file    Active member of club or organization: Not on file    Attends meetings of clubs or organizations: Not on file    Relationship status: Not on file  Other Topics Concern  . Not on file  Social History Narrative   Lives locally with wife.  Does not routinely exercise.  Works in Therap[employer], [employer].     Family History: The patient's family history includes Cancer in his mother; Heart attack in his father.  ROS:   Please see the history of present illness.    All other systems reviewed and are negative.  EKGs/Labs/Other Studies Reviewed:    The following studies were reviewed today: LHC 10/05/2018  EKG:  Nsr, iRBBB, inferior Q wave  Recent Labs: 10/06/2018: BUN 11; Creatinine, Ser 0.80; Hemoglobin 14.9; Platelets 188; Potassium 3.9; Sodium 138 10/16/2018: ALT 38  Recent Lipid Panel    Component Value Date/Time   CHOL 181 10/05/2018 0252   TRIG 167 (H) 10/05/2018 0252   HDL 50 10/05/2018 0252   CHOLHDL 3.6 10/05/2018 0252   VLDL 33 10/05/2018 0252   LDLCALC 98 10/05/2018 0252    Physical Exam:    VS:  BP (!) 152/89   Pulse 76   Temp 98.2 F (36.8 C)   Ht 5\' 7"  (1.702 m)   Wt 178 lb 9.6 oz (81 kg)   SpO2 99%   BMI 27.97 kg/m     Wt Readings from Last 5 Encounters:  03/02/19 178 lb 9.6 oz (81 kg)  02/16/19 170 lb 6.4 oz (77.3 kg)  11/02/18 174 lb 9.6 oz (79.2 kg)  10/16/18 178 lb 6.4 oz (80.9 kg)  10/06/18 170 lb 3.1 oz (77.2 kg)     Constitutional: No acute distress Eyes: sclera non-icteric, normal conjunctiva and lids ENMT: normal dentition, moist mucous membranes Cardiovascular: regular rhythm, normal rate, no murmurs. S1 and S2 normal. Radial pulses normal bilaterally. No jugular venous distention.  Respiratory: clear to auscultation bilaterally GI : normal bowel sounds, soft and nontender. No distention.   MSK: extremities  warm, well perfused. No edema.  NEURO: grossly nonfocal exam, moves all extremities. PSYCH: alert and oriented x 3, normal mood and affect.   ASSESSMENT:    1. NSTEMI (non-ST elevated myocardial infarction) (HCC)   2. Coronary artery disease of native artery of native heart with stable angina pectoris (HCC)    PLAN:     DAPT FOR NSTEMI: I have instructed the patient that dual antiplatelet therapy should be taken for 1 year without interruption.  We have discussed the consequences of interrupted dual antiplatelet therapy and the risk for in-stent thrombosis. Date to consider cessation of Brillinta - 2/13/20201. Indefinite aspirin therapy.   Chest pain - we will initiate Imdur for chest pain since it is relieved with sublingual nitro. Will monitor for response.   HTN - will increase losartan back to 50 mg daily but he should consider splitting dose to morning and evening 25 mg. Can take imdur 30 mg daily at lunch to avoid hypotension and dizziness.   He should contact the office in 2 weeks and inform us of daily blood pressures and chest pain.   TIME SPENT WITH PATIENT: 25 minutes of direct patient care. More than 50% of that time was spent on coordination of care and counseling regarding DAPT and chest pain, htn mgmt.  Weston BrassGayatri , MD [place]  [facility]   Medication Adjustments/Labs and Tests Ordered: Current medicines are reviewed at length with the patient today.  Concerns regarding medicines are outlined above.  Orders Placed This Encounter  Procedures  . EKG 12-Lead   Meds ordered this encounter  Medications  . isosorbide mononitrate (IMDUR) 30 MG 24 hr tablet    Sig: Take 1 tablet (30 mg total) by mouth daily with lunch.    Dispense:  90 tablet    Refill:  3  . losartan (COZAAR) 50 MG tablet    Sig: Take 1 tablet (50 mg total) by mouth daily.    Dispense:  90 tablet    Refill:  3    Patient Instructions  Medication Instructions:   START ISOSORBIDE  MN (  IMDUR)  30 MG  TAKE AT  LUNCH TIME.  Increase Losartan 50 mg -One tablet daily -  CALL OFFICE  IF YOU FEEL BAD  OR LIGHTHEADED    OKAY TO TAKE  YOUR STATIN MEDICATION ( ATORVASTATIN) ANYTIME IN THE EVENING  If you need a refill on your cardiac medications before your next appointment, please call your pharmacy.   Lab work:  Not needed    Testing/Procedures: Not needed  Follow-Up: [facility], you and your health needs are our priority.  As part of our continuing mission to provide you with exceptional heart care, we have created designated Provider Care Teams.  These Care Teams include your primary Cardiologist (physician) and Advanced Practice Providers (APPs -  Physician Assistants and Nurse Practitioners) who all work together to provide you with the care you need, when you need it. . You will need a follow up appointment in  3 months.  Please call our office 2 months in advance to schedule this appointment.  You may see Joni ReiningKathryn Lawrence, DNP, ANP   .  Marland Kitchen.   Any Other Special Instructions Will Be Listed Below (If Applicable).

## 2019-03-02 NOTE — Patient Instructions (Addendum)
Medication Instructions:   START ISOSORBIDE  MN ( IMDUR)  30 MG  TAKE AT  LUNCH TIME.  Increase Losartan 50 mg -One tablet daily -  CALL OFFICE  IF YOU FEEL BAD  OR LIGHTHEADED    OKAY TO TAKE  YOUR STATIN MEDICATION ( ATORVASTATIN) ANYTIME IN THE EVENING  If you need a refill on your cardiac medications before your next appointment, please call your pharmacy.   Lab work:  Not needed    Testing/Procedures: Not needed  Follow-Up: At [facility], you and your health needs are our priority.  As part of our continuing mission to provide you with exceptional heart care, we have created designated Provider Care Teams.  These Care Teams include your primary Cardiologist (physician) and Advanced Practice Providers (APPs -  Physician Assistants and Nurse Practitioners) who all work together to provide you with the care you need, when you need it. . You will need a follow up appointment in  3 months.  Please call our office 2 months in advance to schedule this appointment.  You may see Jory Sims, DNP, ANP   .  Marland Kitchen   Any Other Special Instructions Will Be Listed Below (If Applicable).

## 2019-03-15 MED FILL — ?ATORVASTATIN 40MG TABLET: 40 | 30 days supply | Qty: 60 | Fill #2

## 2019-03-29 MED FILL — LOSARTAN POTASSIUM 25 MG TA: 25 | 30 days supply | Qty: 30 | Fill #5

## 2019-03-29 MED FILL — ?METOPROLOL 25 MG TABLET: 25 | 30 days supply | Qty: 90 | Fill #5

## 2019-03-29 MED FILL — BRILINTA 90 MG TABLET: 90 | 30 days supply | Qty: 60 | Fill #5

## 2019-04-19 MED FILL — ?ATORVASTATIN 40MG TABLET: 40 | 30 days supply | Qty: 60 | Fill #3

## 2019-04-26 MED FILL — BRILINTA 90 MG TABLET: 90 | 30 days supply | Qty: 60 | Fill #6

## 2019-04-26 MED FILL — LOSARTAN POTASSIUM 50 MG TA: 50 | 30 days supply | Qty: 30 | Fill #0

## 2019-04-26 MED FILL — METOPROLOL TARTRATE 25 MG T: 25 | 30 days supply | Qty: 90 | Fill #6

## 2019-05-18 ENCOUNTER — Encounter: Payer: Self-pay | Admitting: Internal Medicine

## 2019-05-18 ENCOUNTER — Ambulatory Visit: Payer: Self-pay | Attending: Internal Medicine | Admitting: Internal Medicine

## 2019-05-18 ENCOUNTER — Other Ambulatory Visit: Payer: Self-pay

## 2019-05-18 DIAGNOSIS — M545 Low back pain, unspecified: Secondary | ICD-10-CM

## 2019-05-18 DIAGNOSIS — R3589 Other polyuria: Secondary | ICD-10-CM

## 2019-05-18 DIAGNOSIS — R358 Other polyuria: Secondary | ICD-10-CM

## 2019-05-18 DIAGNOSIS — N3941 Urge incontinence: Secondary | ICD-10-CM

## 2019-05-18 MED ORDER — TAMSULOSIN HCL 0.4 MG PO CAPS: 0.4000 mg | ORAL_CAPSULE | Freq: Every day | ORAL | 1 refills | Status: DC

## 2019-05-18 MED ORDER — SOLIFENACIN SUCCINATE 5 MG PO TABS: 5.0000 mg | ORAL_TABLET | Freq: Every day | ORAL | 1 refills | Status: DC

## 2019-05-18 MED ORDER — DICLOFENAC SODIUM 1 % TD GEL: 2.0000 g | Freq: Four times a day (QID) | TRANSDERMAL | 2 refills | Status: DC

## 2019-05-18 NOTE — Progress Notes (Signed)
Patient verified DOB Patient has taken medication today. Patient has eaten today. Patient complains of back pain beginning a month ago. Pain is mainly in the morning, no pain or blood while urinating.  Patient states cough is intermittent and possibly due to weather changes. Patients temperatures were 97 and 98 when checked at home.

## 2019-05-18 NOTE — Progress Notes (Signed)
Virtual Visit via Telephone Note Due to current restrictions/limitations of in-office visits due to the COVID-19 pandemic, this scheduled clinical appointment was converted to a telehealth visit  I connected with Andrew Hunter on 05/18/19 at 4:42 p.m by telephone and verified that I am speaking with the correct person using two identifiers. I am in my office.  The patient is at home.  Only the patient,myself and Myrlene Broker [phone]) from [facility] interpreters participated in this encounter.  I discussed the limitations, risks, security and privacy concerns of performing an evaluation and management service by telephone and the availability of in person appointments. I also discussed with the patient that there may be a patient responsible charge related to this service. The patient expressed understanding and agreed to proceed.   History of Present Illness: Patient with history of HTN, CAD,GERD  Pt c/o pain in lower back for a little over a mth.  No initiating factor.   He thinks it is his kidneys or one of his medications.  He was started on Imdur by cardiologist when he was seen in July.  Took it only once because it made him feel bad.  -pain is in the center and LT side of the lower back -No radiation down leg or up the back -pain more so in the mornings and with prolong sitting.  Goes away when he is up walking around and working.  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  Started in February after hosp discgh and has gotten progressive worse.  Wakes 2-3 times at nights to urinate.  Feels he completely empties bladder Current Outpatient Medications on File Prior to Visit  Medication Sig Dispense Refill  . acetaminophen (TYLENOL) 500 MG tablet Take 500-1,000 mg by mouth every 6 (six) hours as needed (for pain or headaches).    Marland Kitchen aspirin EC 81 MG  tablet Take 1 tablet (81 mg total) by mouth daily. 90 tablet 3  . atorvastatin (LIPITOR) 80 MG tablet Take 1 tablet (80 mg total) by mouth daily at 6 PM. 30 tablet 11  . calcium carbonate (TUMS - DOSED IN MG ELEMENTAL CALCIUM) 500 MG chewable tablet Chew 1 tablet by mouth daily as needed for indigestion or heartburn.    . isosorbide mononitrate (IMDUR) 30 MG 24 hr tablet Take 1 tablet (30 mg total) by mouth daily with lunch. 90 tablet 3  . losartan (COZAAR) 50 MG tablet Take 1 tablet (50 mg total) by mouth daily. 90 tablet 3  . metoprolol tartrate (LOPRESSOR) 25 MG tablet Take 1.5 tablets (37.5 mg total) by mouth 2 (two) times daily. 90 tablet 11  . Multiple Vitamin (MULTIVITAMIN) capsule Take 1 capsule by mouth daily.    . nitroGLYCERIN (NITROSTAT) 0.4 MG SL tablet Place 1 tablet (0.4 mg total) under the tongue every 5 (five) minutes x 3 doses as needed for chest pain. 25 tablet 1  . Omega-3 Fatty Acids (FISH OIL PO) Take 1 capsule by mouth daily.    . ticagrelor (BRILINTA) 90 MG TABS tablet Take 1 tablet (90 mg total) by mouth 2 (two) times daily. 60 tablet 11   No current facility-administered medications on file prior to visit.     Observations/Objective: No direct observation done as this was a telephone encounter  Assessment and Plan: 1. Left-sided low back pain without sciatica, unspecified chronicity This sounds more  like arthritis or degenerative disc.  Recommend stretching exercises.  Give a trial of Voltaren gel.  Will get imaging studies. - PSA; Future - diclofenac sodium (VOLTAREN) 1 % GEL; Apply 2 g topically 4 (four) times daily.  Dispense: 100 g; Refill: 2 - DG Lumbar Spine Complete; Future  2. Urge incontinence of urine Symptoms suggest urge incontinence for BPH.  We will give a trial first of Flomax and have him follow-up in 1 month.  If no improvement with this medication, then we will stop this and try him with Vesicare. - PSA; Future - Urinalysis; Future  3.  Polyuria - PSA; Future - Urinalysis; Future - Hemoglobin A1c; Future - tamsulosin (FLOMAX) 0.4 MG CAPS capsule; Take 1 capsule (0.4 mg total) by mouth daily.  Dispense: 30 capsule; Refill: 1   Follow Up Instructions: 1 mth   I discussed the assessment and treatment plan with the patient. The patient was provided an opportunity to ask questions and all were answered. The patient agreed with the plan and demonstrated an understanding of the instructions.   The patient was advised to call back or seek an in-person evaluation if the symptoms worsen or if the condition fails to improve as anticipated.  I provided 27 minutes of non-face-to-face time during this encounter.   Karle Plumber, MD

## 2019-05-21 MED FILL — DICLOFENAC SODIUM 1% GEL: 1 | 13 days supply | Qty: 100 | Fill #0

## 2019-05-21 MED FILL — TAMSULOSIN HCL 0.4 MG CAP: 0.4 | 30 days supply | Qty: 30 | Fill #0

## 2019-05-25 ENCOUNTER — Other Ambulatory Visit: Payer: Self-pay

## 2019-05-25 ENCOUNTER — Ambulatory Visit: Payer: Self-pay | Attending: Internal Medicine

## 2019-05-25 DIAGNOSIS — M545 Low back pain, unspecified: Secondary | ICD-10-CM

## 2019-05-25 DIAGNOSIS — I25118 Atherosclerotic heart disease of native coronary artery with other forms of angina pectoris: Secondary | ICD-10-CM

## 2019-05-25 DIAGNOSIS — R3589 Other polyuria: Secondary | ICD-10-CM

## 2019-05-25 DIAGNOSIS — R358 Other polyuria: Secondary | ICD-10-CM

## 2019-05-25 DIAGNOSIS — N3941 Urge incontinence: Secondary | ICD-10-CM

## 2019-05-25 MED FILL — BRILINTA 90 MG TABLET: 90 | 30 days supply | Qty: 60 | Fill #7

## 2019-05-25 MED FILL — METOPROLOL TARTRATE 25 MG T: 25 | 30 days supply | Qty: 90 | Fill #7

## 2019-05-25 MED FILL — LOSARTAN POTASSIUM 50 MG TA: 50 | 30 days supply | Qty: 30 | Fill #1

## 2019-05-25 MED FILL — ?ATORVASTATIN 40MG TABLET: 40 | 30 days supply | Qty: 60 | Fill #4

## 2019-05-26 LAB — URINALYSIS
Bilirubin, UA: NEGATIVE
Glucose, UA: NEGATIVE
Ketones, UA: NEGATIVE
Leukocytes,UA: NEGATIVE
Nitrite, UA: NEGATIVE
Protein,UA: NEGATIVE
RBC, UA: NEGATIVE
Specific Gravity, UA: 1.017 (ref 1.005–1.030)
Urobilinogen, Ur: 0.2 mg/dL (ref 0.2–1.0)
pH, UA: 8 — ABNORMAL HIGH (ref 5.0–7.5)

## 2019-05-26 LAB — BASIC METABOLIC PANEL
BUN/Creatinine Ratio: 18 (ref 10–24)
BUN: 14 mg/dL (ref 8–27)
CO2: 26 mmol/L (ref 20–29)
Calcium: 10 mg/dL (ref 8.6–10.2)
Chloride: 99 mmol/L (ref 96–106)
Creatinine, Ser: 0.78 mg/dL (ref 0.76–1.27)
GFR calc Af Amer: 113 mL/min/{1.73_m2} (ref >59)
GFR calc non Af Amer: 98 mL/min/{1.73_m2} (ref >59)
Glucose: 110 mg/dL — ABNORMAL HIGH (ref 65–99)
Potassium: 4.7 mmol/L (ref 3.5–5.2)
Sodium: 137 mmol/L (ref 134–144)

## 2019-05-26 LAB — CBC
Hematocrit: 47.3 % (ref 37.5–51.0)
Hemoglobin: 16.3 g/dL (ref 13.0–17.7)
MCH: 33.1 pg — ABNORMAL HIGH (ref 26.6–33.0)
MCHC: 34.5 g/dL (ref 31.5–35.7)
MCV: 96 fL (ref 79–97)
Platelets: 202 10*3/uL (ref 150–450)
RBC: 4.92 x10E6/uL (ref 4.14–5.80)
RDW: 12.6 % (ref 11.6–15.4)
WBC: 6.7 10*3/uL (ref 3.4–10.8)

## 2019-05-26 LAB — PSA: Prostate Specific Ag, Serum: 0.9 ng/mL (ref 0.0–4.0)

## 2019-05-26 LAB — HEMOGLOBIN A1C
Est. average glucose Bld gHb Est-mCnc: 128 mg/dL
Hgb A1c MFr Bld: 6.1 % — ABNORMAL HIGH (ref 4.8–5.6)

## 2019-05-27 ENCOUNTER — Encounter: Payer: Self-pay | Admitting: Internal Medicine

## 2019-05-27 DIAGNOSIS — R7303 Prediabetes: Secondary | ICD-10-CM | POA: Insufficient documentation

## 2019-06-28 ENCOUNTER — Other Ambulatory Visit: Payer: Self-pay | Admitting: Internal Medicine

## 2019-06-28 MED FILL — LOSARTAN POTASSIUM 50 MG TA: 50 | 30 days supply | Qty: 30 | Fill #2

## 2019-06-28 MED FILL — $BRILINTA 90 MG TABLET: 90 | 90 days supply | Qty: 180 | Fill #8

## 2019-06-28 MED FILL — ?METOPROLOL 25 MG TABLET: 25 | 30 days supply | Qty: 90 | Fill #8

## 2019-06-28 MED FILL — ?ATORVASTATIN 40MG TABLET: 40 | 30 days supply | Qty: 60 | Fill #0

## 2019-07-30 MED FILL — LOSARTAN POTASSIUM 50 MG TA: 50 | 30 days supply | Qty: 30 | Fill #3

## 2019-07-30 MED FILL — ?ATORVASTATIN 40MG TABLET: 40 | 30 days supply | Qty: 60 | Fill #1

## 2019-07-30 MED FILL — ?METOPROLOL 25 MG TABLET: 25 | 30 days supply | Qty: 90 | Fill #9

## 2019-08-27 MED FILL — ?METOPROLOL 25 MG TABLET: 25 | 30 days supply | Qty: 90 | Fill #10

## 2019-08-27 MED FILL — ?ATORVASTATIN 40MG TABLET: 40 | 30 days supply | Qty: 60 | Fill #2

## 2019-08-27 MED FILL — LOSARTAN POTASSIUM 50 MG TA: 50 | 30 days supply | Qty: 30 | Fill #4

## 2019-09-20 MED FILL — $BRILINTA 90 MG TABLET: 90 | 30 days supply | Qty: 60 | Fill #9

## 2019-09-20 MED FILL — ?METOPROLOL 25 MG TABLET: 25 | 30 days supply | Qty: 90 | Fill #11

## 2019-09-20 MED FILL — LOSARTAN POTASSIUM 50 MG TA: 50 | 30 days supply | Qty: 30 | Fill #5

## 2019-10-19 ENCOUNTER — Telehealth: Payer: Self-pay | Admitting: *Deleted

## 2019-10-19 ENCOUNTER — Other Ambulatory Visit: Payer: Self-pay | Admitting: Internal Medicine

## 2019-10-19 ENCOUNTER — Encounter: Payer: Self-pay | Admitting: Internal Medicine

## 2019-10-19 ENCOUNTER — Telehealth (INDEPENDENT_AMBULATORY_CARE_PROVIDER_SITE_OTHER): Payer: Managed Care, Other (non HMO) | Admitting: Internal Medicine

## 2019-10-19 VITALS — HR 84 | Ht 67.0 in | Wt 166.0 lb

## 2019-10-19 DIAGNOSIS — E781 Pure hyperglyceridemia: Secondary | ICD-10-CM | POA: Diagnosis not present

## 2019-10-19 DIAGNOSIS — I25118 Atherosclerotic heart disease of native coronary artery with other forms of angina pectoris: Secondary | ICD-10-CM

## 2019-10-19 DIAGNOSIS — R079 Chest pain, unspecified: Secondary | ICD-10-CM | POA: Diagnosis not present

## 2019-10-19 DIAGNOSIS — I214 Non-ST elevation (NSTEMI) myocardial infarction: Secondary | ICD-10-CM

## 2019-10-19 DIAGNOSIS — I10 Essential (primary) hypertension: Secondary | ICD-10-CM

## 2019-10-19 MED ORDER — ATORVASTATIN CALCIUM 40 MG PO TABS: 40.0000 mg | ORAL_TABLET | Freq: Every day | ORAL | 3 refills | Status: DC

## 2019-10-19 MED ORDER — LOSARTAN POTASSIUM 50 MG PO TABS: 50.0000 mg | ORAL_TABLET | Freq: Every day | ORAL | 3 refills | Status: DC

## 2019-10-19 MED ORDER — NITROGLYCERIN 0.4 MG SL SUBL: 0.4000 mg | SUBLINGUAL_TABLET | SUBLINGUAL | 6 refills | Status: DC | PRN

## 2019-10-19 MED ORDER — ISOSORBIDE MONONITRATE ER 30 MG PO TB24: 30.0000 mg | ORAL_TABLET | Freq: Every day | ORAL | 3 refills | Status: DC

## 2019-10-19 MED FILL — LOSARTAN POTASSIUM 50 MG TA: 50 | 30 days supply | Qty: 30 | Fill #6

## 2019-10-19 MED FILL — ISOSORBIDE MN ER 30 MG TAB: 30 | 30 days supply | Qty: 30 | Fill #0

## 2019-10-19 MED FILL — ?ATORVASTATIN 40MG TABLET: 40 | 30 days supply | Qty: 30 | Fill #0

## 2019-10-19 MED FILL — NITROGLYCERIN 0.4 MG TAB SL: 0.4 | 25 days supply | Qty: 25 | Fill #0

## 2019-10-19 NOTE — Progress Notes (Signed)
Virtual Visit via Telephone Note   This visit type was conducted due to national recommendations for restrictions regarding the COVID-19 Pandemic (e.g. social distancing) in an effort to limit this patient's exposure and mitigate transmission in our community.  Due to his co-morbid illnesses, this patient is at least at moderate risk for complications without adequate follow up.  This format is felt to be most appropriate for this patient at this time.  The patient did not have access to video technology/had technical difficulties with video requiring transitioning to audio format only (telephone).  All issues noted in this document were discussed and addressed.  No physical exam could be performed with this format.  Please refer to the patient's chart for his  consent to telehealth for [facility].   Date:  10/19/2019   ID:  Andrew Hunter, DOB 1961-06-22, MRN [id number]  Patient Location: Home Provider Location: Other:  [place], [place]  PCP:  Ladell Pier, MD  Cardiologist:  Elouise Munroe, MD  Electrophysiologist:  None   Evaluation Performed:  Follow-Up Visit  Chief Complaint:  F/u NSTEMI  History of Present Illness:    Andrew Hunter is a 59 y.o. male with NSTEMI recently hospitalized status post FFR guided mid LAD PCI with drug-eluting stent. He also has a 60 to 70% ostial first obtuse marginal branch stenosis that was deemed best for medical therapy. LV function was normal. He was initiated on dual antiplatelet therapy with aspirin and Brilinta for 1 year uninterrupted.   Visit completed by telephone with interpreter [facility] ID # [phone].  Daily chest pain, makes him want to drink water and chest pain goes away.   Discussed imdur to be taken daily. Discussed additional prn use of nitro. Patient has been taking imdur PRN and does not have a script for nitro.   All day chest pain, 5 x a week. This has been going on since placement of the stent, but seems to be worsening  slightly. Sometimes worse with wearing the mask all day, better when he takes it off. Left and substernal pain, not worse with deep breathing. Sometimes worse with exertion. Improves with nitro reliably.   Tolerating losartan 50 mg daily, atorvastatin 40 mg daily, will increase to 80 mg daily.   He notes that his BP is high today. 180/102. When his BP goes high he feels "desperate", but today doesn't feel like that.   The patient does not have symptoms concerning for COVID-19 infection (fever, chills, cough, or new shortness of breath).    Past Medical History:  Diagnosis Date  . Coronary artery disease   . Essential hypertension    Past Surgical History:  Procedure Laterality Date  . CORONARY STENT INTERVENTION N/A 10/05/2018   Procedure: CORONARY STENT INTERVENTION;  Surgeon: Lorretta Harp, MD;  Location: [place] [facility];  Service: Cardiovascular;  Laterality: N/A;  MID LAD  . INTRAVASCULAR PRESSURE WIRE/FFR STUDY N/A 10/05/2018   Procedure: INTRAVASCULAR PRESSURE WIRE/FFR STUDY;  Surgeon: Lorretta Harp, MD;  Location: [place] [facility];  Service: Cardiovascular;  Laterality: N/A;  FFR LAD  . LEFT HEART CATH AND CORONARY ANGIOGRAPHY N/A 10/05/2018   Procedure: LEFT HEART CATH AND CORONARY ANGIOGRAPHY;  Surgeon: Lorretta Harp, MD;  Location: [place] [facility];  Service: Cardiovascular;  Laterality: N/A;     Current Meds  Medication Sig  . acetaminophen (TYLENOL) 500 MG tablet Take 500-1,000 mg by mouth every 6 (six) hours as needed (for pain or headaches).  Marland Kitchen  aspirin EC 81 MG tablet Take 1 tablet (81 mg total) by mouth daily.  Marland Kitchen atorvastatin (LIPITOR) 40 MG tablet Take 1 tablet (40 mg total) by mouth daily.  . calcium carbonate (TUMS - DOSED IN MG ELEMENTAL CALCIUM) 500 MG chewable tablet Chew 1 tablet by mouth daily as needed for indigestion or heartburn.  . isosorbide mononitrate (IMDUR) 30 MG 24 hr tablet Take 1 tablet (30 mg total) by mouth daily with lunch.  .  losartan (COZAAR) 50 MG tablet Take 1 tablet (50 mg total) by mouth daily.  . metoprolol tartrate (LOPRESSOR) 25 MG tablet Take 25 mg by mouth. Take 25 mg ( 1 tablet)  in the morning and 50 mg ( 2 tablets)  in the evening  . Omega-3 Fatty Acids (FISH OIL PO) Take 1 capsule by mouth daily.  . ticagrelor (BRILINTA) 90 MG TABS tablet Take 1 tablet (90 mg total) by mouth 2 (two) times daily.  . [DISCONTINUED] atorvastatin (LIPITOR) 40 MG tablet Take 40 mg by mouth daily.  . [DISCONTINUED] isosorbide mononitrate (IMDUR) 30 MG 24 hr tablet Take 1 tablet (30 mg total) by mouth daily with lunch. (Patient taking differently: Take 30 mg by mouth as needed. )  . [DISCONTINUED] losartan (COZAAR) 50 MG tablet Take 1 tablet (50 mg total) by mouth daily.     Allergies:   Patient has no known allergies.   Social History   Tobacco Use  . Smoking status: Never Smoker  . Smokeless tobacco: Never Used  Substance Use Topics  . Alcohol use: Yes    Comment: 2 beers/month  . Drug use: Never     Family Hx: The patient's family history includes Cancer in his mother; Heart attack in his father.  ROS:   Please see the history of present illness.     All other systems reviewed and are negative.   Prior CV studies:   The following studies were reviewed today:    Labs/Other Tests and Data Reviewed:    EKG:  No ECG reviewed.  Recent Labs: 05/25/2019: BUN 14; Creatinine, Ser 0.78; Hemoglobin 16.3; Platelets 202; Potassium 4.7; Sodium 137   Recent Lipid Panel Lab Results  Component Value Date/Time   CHOL 181 10/05/2018 02:52 AM   TRIG 167 (H) 10/05/2018 02:52 AM   HDL 50 10/05/2018 02:52 AM   CHOLHDL 3.6 10/05/2018 02:52 AM   LDLCALC 98 10/05/2018 02:52 AM    Wt Readings from Last 3 Encounters:  10/19/19 166 lb (75.3 kg)  03/02/19 178 lb 9.6 oz (81 kg)  02/16/19 170 lb 6.4 oz (77.3 kg)     Objective:    Vital Signs:  Pulse 84   Ht 5\' 7"  (1.702 m)   Wt 166 lb (75.3 kg)   BMI 26.00 kg/m      VITAL SIGNS:  reviewed GEN:  no acute distress RESPIRATORY:  normal respiratory effort, no increased work of breathing. NEURO:  alert and oriented x 3, no audible deficit PSYCH:  normal affect  ASSESSMENT & PLAN:    1. Chest pain, unspecified type   2. NSTEMI (non-ST elevated myocardial infarction) (HCC)   3. Coronary artery disease of native artery of native heart with stable angina pectoris (HCC)   4. Hypertriglyceridemia   5. Essential hypertension    Chest pain - nearly daily chest pain that is nitro responsive. Confusion with dosing of Imdur and nitro. We will complete a treadmill nuc. Confirmed how to take imdur and nitro and had him repeat it  back to me through interpreter.  HLD - Will take 40 mg atorvastatin daily.   HTN - will continue losartan 50 mg daily. Will take an additional 25 mg if BP goes higher than 160/100.   COVID-19 Education: The signs and symptoms of COVID-19 were discussed with the patient and how to seek care for testing (follow up with PCP or arrange E-visit).  The importance of social distancing was discussed today.  Time:   Today, I have spent 30 minutes with the patient with telehealth technology discussing the above problems. I spent greater than 40 minutes on the encounter independently reviewing coronary angiogram, echocardiogram, and primary care doctor's notes.   Medication Adjustments/Labs and Tests Ordered: Current medicines are reviewed at length with the patient today.  Concerns regarding medicines are outlined above.   Tests Ordered: Orders Placed This Encounter  Procedures  . MYOCARDIAL PERFUSION IMAGING    Medication Changes: Meds ordered this encounter  Medications  . isosorbide mononitrate (IMDUR) 30 MG 24 hr tablet    Sig: Take 1 tablet (30 mg total) by mouth daily with lunch.    Dispense:  90 tablet    Refill:  3  . losartan (COZAAR) 50 MG tablet    Sig: Take 1 tablet (50 mg total) by mouth daily.    Dispense:  90 tablet     Refill:  3  . nitroGLYCERIN (NITROSTAT) 0.4 MG SL tablet    Sig: Place 1 tablet (0.4 mg total) under the tongue every 5 (five) minutes x 3 doses as needed for chest pain.    Dispense:  25 tablet    Refill:  6  . atorvastatin (LIPITOR) 40 MG tablet    Sig: Take 1 tablet (40 mg total) by mouth daily.    Dispense:  90 tablet    Refill:  3    Follow Up: after stress test in office.   Signed, Parke Poisson, MD  10/19/2019 12:39 PM    [facility]

## 2019-10-19 NOTE — Telephone Encounter (Signed)
RN spoke to patient with interpreter Irving Burton ID # [phone]) . Instruction were given  from today's virtual visit  10/19/19.  AVS SUMMARY has been sent by mychart . The instruction in spanish  for stress Myoview mailed. Patient aware of covid testing as well on March 4,2021 Stress Myoview on March 8,2021 and March 10 ,2021 for follow up in office visit.   Patient verbalized understanding through intreperpreter

## 2019-10-19 NOTE — Addendum Note (Signed)
Addended by: Weston Brass A on: 10/19/2019 09:31 PM   Modules accepted: Level of Service

## 2019-10-19 NOTE — Patient Instructions (Addendum)
Medication Instructions:  Refill nitroglycerin Sublingual ( place under your tongue) for a year.  Continue imdur( isosorbide) 30 mg daily, atorvastatin will stay at 40 mg daily and all other meds the same.   Continue brilinta until your current prescription runs out, then we can stop.   If BP >160/100, can take a half tab of losartan (25 mg).  *If you need a refill on your cardiac medications before your next appointment, please call your pharmacy*    Lab Work: Will need a covid test on October 25, 2019 at 2:20 pm  Please go to [address] .  You have test done while in your car. Once test is completed got home and quaratine until Monday October 29, 2019 at 9:45 am     Testing/Procedures: Will be schedule at [place] has requested that you have en exercise stress myoview.  Please follow instruction sheet, as given.    Follow-Up: At [facility], you and your health needs are our priority.  As part of our continuing mission to provide you with exceptional heart care, we have created designated Provider Care Teams.  These Care Teams include your primary Cardiologist (physician) and Advanced Practice Providers (APPs -  Physician Assistants and Nurse Practitioners) who all work together to provide you with the care you need, when you need it.  We recommend signing up for the patient portal called "MyChart".  Sign up information is provided on this After Visit Summary.  MyChart is used to connect with patients for Virtual Visits (Telemedicine).  Patients are able to view lab/test results, encounter notes, upcoming appointments, etc.  Non-urgent messages can be sent to your provider as well.   To learn more about what you can do with MyChart, go to [email or web address].    Your next appointment:   4 week(s)  The format for your next appointment:   In Person  Provider:   Cherlynn Kaiser, MD   Other Instructions N/A      Carlena Sax nuclear Nuclear Medicine Exam Un examen de medicina nuclear es un estudio de diagnstico por imagen segura indolora. Ayuda al mdico a Hydrographic surveyor y Designer, jewellery. Tambin proporciona informacin sobre cmo funcionan los rganos y cmo estn estructurados. Para un examen de Haematologist, se le proporcionar un marcador radiaoctivo. Los rganos del cuerpo absorben esta sustancia. Una gran mquina de escaneo Pension scheme manager y crea imgenes de las reas que el mdico desea examinar. Hay varios tipos de [place] nuclear. Estas incluyen los siguiente:  Exploracin por tomografa computarizada (TC).  Resonancia magntica (RM).  Tomografa por emisin de protones (TEP).  Tomografa de emisin monofotnica (SPECT). Informe al mdico sobre:  Cualquier alergia que tenga.  Todos los [employer], incluidos vitaminas, hierbas, gotas oftlmicas, cremas y medicamentos de venta libre.  Cualquier problema previo que usted o algn miembro de su familia hayan tenido con los anestsicos.  Cualquier trastorno de la sangre que tenga.  Cirugas a las que se haya sometido.  Cualquier afeccin mdica que tenga.  Si est embarazada o podra estarlo.  Si est amamantando. Cules son los riesgos? En general, se trata de un procedimiento seguro. Sin embargo, pueden ocurrir problemas, Programmer, applications reaccin alrgica al Geneticist, molecular, pero esto es poco frecuente. Qu ocurre antes del procedimiento? Medicamentos Consulte al mdico sobre:  Quarry manager o suspender los medicamentos que toma habitualmente. Esto es muy importante si toma medicamentos para la diabetes o anticoagulantes.  Tomar  medicamentos como aspirina e ibuprofeno. Estos medicamentos pueden tener un efecto anticoagulante en la [place]. No tome estos medicamentos a menos que el mdico se lo indique.  Tomar medicamentos de [employer], vitaminas, hierbas y suplementos. Instrucciones generales  Siga las  instrucciones del mdico respecto de las restricciones en las comidas o las bebidas.  No use objetos metlicos.  Use ropa cmoda y holgada. Es posible que le pidan que use una bata de hospital para el procedimiento.  Si tiene estudios de diagnstico por imgenes previos, como radiografas, trigalos al examen. Qu ocurre durante el procedimiento?   Pueden colocarle una va intravenosa en una vena.  Le pedirn que se acueste sobre una camilla o se siente en una silla.  Se le proporcionar el marcador radioactivo. Pueden darle lo siguiente: ? Una pldora o un lquido para tragar. ? Una inyeccin. ? Medicamentos a travs de la va intravenosa (IV). ? Un gas para inhalar.  Se utilizar una gran mquina de escaneo para crear imgenes de su cuerpo. Despus de tomar las imgenes, probablemente deba esperar para que el mdico pueda asegurarse de que se tomaron suficientes imgenes. El procedimiento puede variar segn el mdico y el hospital. Sander Nephew ocurre despus del procedimiento?  Despus del procedimiento, puede irse a casa y Licensed conveyancer sus actividades habituales, a menos que el mdico le indique lo contrario.  Beba suficiente agua para mantener la orina de color amarillo plido. Esto ayudar a Musician radioactivo del cuerpo.  Es su responsabilidad retirar [employer] del procedimiento. Pregntele a su mdico, o a un miembro del personal del departamento donde se realice el procedimiento, cundo estarn [employer].  Busque ayuda de inmediato si tiene problemas para respirar. [place] nuclear son estudios de diagnstico por imgenes seguros e indoloros que proporcionan informacin sobre el funcionamiento de los rganos. Tambin se usan para Hydrographic surveyor y diagnosticar enfermedades de diversos rganos del cuerpo.  Siga las instrucciones del mdico respecto de las restricciones para las comidas y las bebidas. Pregunte si debe cambiar o suspender  los medicamentos que toma.  Durante el procedimiento, le darn un marcador radioactivo. Una mquina grande de escaneo crear imgenes de su cuerpo.  Despus del procedimiento, puede regresar a su hogar y reanudar sus actividades normales. Siga las instrucciones de su mdico.  Busque ayuda de inmediato si tiene problemas para respirar. Esta informacin no tiene Marine scientist el consejo del mdico. Asegrese de hacerle al mdico cualquier pregunta que tenga. Document Revised: 08/28/2018 Document Reviewed: 08/28/2018 Elsevier Patient Education  [place].   Nitroglycerin sublingual tablets Qu es este medicamento? La NITROGLICERINA es un tipo de vasodilatador. Relaja los vasos sanguneos e incrementa as el suministro de sangre y oxgeno al corazn. Este medicamento se utiliza para Best boy en el pecho causado por la angina de [place]. Puede utilizarse para Environmental education officer en el pecho antes de Optometrist actividades como subir escaleras, salir al exterior cuando hace fro o tener relaciones sexuales. Este medicamento puede ser utilizado para otros usos; si tiene alguna pregunta consulte con su proveedor de atencin mdica o con su farmacutico. MARCAS COMUNES: Nitroquick, Nitrostat, Nitrotab Qu le debo informar a mi profesional de la salud antes de tomar este medicamento? Necesita saber si usted presenta alguno de los [employer] o situaciones:  anemia  lesin de la cabeza, derrame cerebral reciente o hemorragia cerebral  enfermedad heptica  ataque cardiaco previo  una reaccin alrgica o inusual a la nitroglicerina, a  otros medicamentos, alimentos, colorantes o conservantes  si est embarazada o buscando quedar embarazada  si est amamantando a un beb Cmo debo utilizar este medicamento? Tome este medicamento por va oral segn sea necesario. Al sentir los primeros signos de un ataque de angina (opresin o dolor en el pecho), colquese una tableta  debajo de la lengua. Tambin puede tomar Coca-Cola 5  10 minutos antes de una situacin que puede provocarle dolor en el pecho. Siga las instrucciones de la etiqueta del [place]. Deje que la tableta se disuelva debajo de la lengua. No la trague entera. Reemplace la dosis si traga la tableta de manera accidental. Ser mejor que su boca no est seca. La saliva contribuir a disolver la tableta ms rpidamente. No coma, beba, fume ni mastique tabaco mientras la tableta se est disolviendo. Si no se siente mejor dentre 5 minutos despus de tomar UNA dosis de nitroglicerina, llamar al numero 9-1-1 inmediatamento para buscar ayuda mdica de emergencia. No tome ms de 3 tabletas de nitroglicerina en el transcurro de 15 minutos. Si toma este medicamento a menudo para [employer] de angina, su mdico o su profesional de la salud le pueden dar intrucciones diferentes para manejar sus sntomas. Si sus sntomas no desaparecen despus de seguir estas instrucciones, es importante llamar al 9-1-1 inmediatamente. No tome ms de 3 tabletas de nitroglicerina en el transcurro de 15 minutos. Hable con su pediatra para informarse acerca del uso de este medicamento en nios. Puede requerir atencin especial. Sobredosis: Pngase en contacto inmediatamente con un centro toxicolgico o una sala de urgencia si usted cree que haya tomado demasiado medicamento. ATENCIN: [employer] es solo para usted. No comparta este medicamento con nadie. Qu sucede si me olvido de una dosis? No se aplica en este caso. Este medicamento es slo para usar como sea necesario. Qu puede interactuar con este medicamento? No tome este medicamento con ninguno de los siguientes frmacos: ciertos medicamentos para migraas, tales como ergotamina y dihidroergotamina medicamentos utilizados para tratar disfuncin erctil, tales como avanafil, sildenafil, tadalafil y vardenafil riociguat Este medicamento tambin puede interactuar  con los siguientes medicamentos: alteplasa aspirina heparina medicamentos para la alta presin sangunea medicamentos para la depresin mental otros medicamentos utilizados para tratar la angina de pecho fenotiazinas, tales como clorpromazina, mesoridazina, Government social research officer, tioridazina Puede ser que esta lista no menciona todas las posibles interacciones. Informe a su profesional de [employer]	[place] de [employer] productos a base de hierbas, medicamentos de [place] o suplementos nutritivos que est tomando. Si usted fuma, consume bebidas alcohlicas o si utiliza drogas ilegales, indqueselo tambin a su profesional de [employer]	[place]. Algunas sustancias pueden interactuar con su medicamento. A qu debo estar atento al usar Coca-Cola? Informe a su mdico o a su profesional de la salud si observa que el medicamento ya no surte [place]. Lleve este medicamento consigo en todo momento. Sintese o recustese para evitar caerse si siente mareos o sensacin de desmayo luego de ingerir Coca-Cola. Intente permanecer en calma. Esto le ayudar a sentirse mejor ms rpidamente. Si siente mareos, respire profundamente varias veces y acustese con los pies apoyados hacia arriba o inclnese hacia adelante y coloque la cabeza entre las rodillas. Puede experimentar mareos o somnolencia. No conduzca ni utilice maquinaria ni haga nada que Associate Professor en estado de alerta hasta que sepa cmo le afecta este medicamento. No se siente ni se ponga de pie con rapidez, especialmente si es un paciente de edad avanzada. Esto reduce el riesgo  de [employer]. El alcohol puede aumentar los mareos y la somnolencia. Evite consumir bebidas alcohlicas. No se trate usted mismo si tiene tos, resfro o Nutritional therapist est tomando este medicamento sin Teacher, adult education con su mdico o con su profesional de [employer]	[place]. Algunos ingredientes pueden aumentar los posibles efectos secundarios. Qu efectos secundarios puedo tener al [employer] este  medicamento? Efectos secundarios que debe informar a su mdico o a Barrister's clerk de la salud tan pronto como sea posible:  visin borrosa  boca seca  erupcin cutnea  sudoracin  sensacin de presin extrema en la cabeza  cansancio o debilidad inusual Efectos secundarios que, por lo general, no requieren atencin mdica (debe informarlos a su mdico o a su profesional de la salud si persisten o si son molestos):  enrojecimiento de la cara o cuello  dolor de cabeza  latidos cardiacos irregulares, palpitaciones  nuseas, vmito Puede ser que [employer] no menciona todos los posibles efectos secundarios. Comunquese a su mdico por asesoramiento mdico Humana Inc. Usted puede informar los efectos secundarios a la FDA por telfono al 1-800-FDA-1088. Dnde debo guardar mi medicina? Mantngala fuera del alcance de los nios. Gurdela a [employer], entre 20 y 30 grados C (34 y 47 grados F). Gurdela en su envase original. Protjala de la luz y de la humedad. Mantenga el envase bien cerrado. Deseche todo el medicamento que no haya utilizado, despus de la fecha de vencimiento. ATENCIN: Este folleto es un resumen. Puede ser que no cubra toda la posible informacin. Si usted tiene preguntas acerca de esta medicina, consulte con su mdico, su farmacutico o su profesional de Technical sales engineer.  2020 Elsevier/Gold Standard (2016-09-09 00:00:00)

## 2019-10-25 ENCOUNTER — Other Ambulatory Visit (HOSPITAL_COMMUNITY): Payer: Managed Care, Other (non HMO)

## 2019-10-25 ENCOUNTER — Other Ambulatory Visit (HOSPITAL_COMMUNITY)
Admission: RE | Admit: 2019-10-25 | Discharge: 2019-10-25 | Disposition: A | Discharge status: Home / Self Care | Payer: Managed Care, Other (non HMO) | Source: Ambulatory Visit | Attending: Internal Medicine | Admitting: Internal Medicine

## 2019-10-25 DIAGNOSIS — Z01812 Encounter for preprocedural laboratory examination: Secondary | ICD-10-CM | POA: Insufficient documentation

## 2019-10-25 DIAGNOSIS — Z20822 Contact with and (suspected) exposure to covid-19: Secondary | ICD-10-CM | POA: Insufficient documentation

## 2019-10-26 ENCOUNTER — Telehealth (HOSPITAL_COMMUNITY): Payer: Self-pay | Admitting: *Deleted

## 2019-10-26 LAB — SARS CORONAVIRUS 2 (TAT 6-24 HRS): SARS Coronavirus 2: NEGATIVE

## 2019-10-26 NOTE — Telephone Encounter (Signed)
Patient given detailed instructions per Myocardial Perfusion Study Information Sheet for the test on 10/29/19 at 10:00. Patient notified to arrive 15 minutes early and that it is imperative to arrive on time for appointment to keep from having the test rescheduled.  If you need to cancel or reschedule your appointment, please call the office within 24 hours of your appointment. . Patient verbalized understanding.Andrew Hunter

## 2019-10-29 ENCOUNTER — Ambulatory Visit (HOSPITAL_COMMUNITY): Payer: Managed Care, Other (non HMO) | Attending: Internal Medicine

## 2019-10-29 ENCOUNTER — Other Ambulatory Visit: Payer: Self-pay

## 2019-10-29 DIAGNOSIS — I25118 Atherosclerotic heart disease of native coronary artery with other forms of angina pectoris: Secondary | ICD-10-CM | POA: Diagnosis not present

## 2019-10-29 DIAGNOSIS — R079 Chest pain, unspecified: Secondary | ICD-10-CM | POA: Diagnosis present

## 2019-10-29 DIAGNOSIS — I214 Non-ST elevation (NSTEMI) myocardial infarction: Secondary | ICD-10-CM | POA: Insufficient documentation

## 2019-10-29 LAB — MYOCARDIAL PERFUSION IMAGING
Estimated workload: 12 METS
Exercise duration (min): 10 min
Exercise duration (sec): 10 s
LV dias vol: 72 mL (ref 62–150)
LV sys vol: 22 mL
MPHR: 160 {beats}/min
Peak HR: 153 {beats}/min
Percent HR: 95 %
Rest HR: 74 {beats}/min
SDS: 0
SRS: 0
SSS: 0
TID: 0.94

## 2019-10-29 MED ORDER — TECHNETIUM TC 99M TETROFOSMIN IV KIT
10.3000 | PACK | Freq: Once | INTRAVENOUS | Status: AC | PRN
  Administered 2019-10-29: 10.3 via INTRAVENOUS
  Filled 2019-10-29: qty 11

## 2019-10-29 MED ORDER — TECHNETIUM TC 99M TETROFOSMIN IV KIT
30.5000 | PACK | Freq: Once | INTRAVENOUS | Status: AC | PRN
  Administered 2019-10-29: 30.5 via INTRAVENOUS
  Filled 2019-10-29: qty 31

## 2019-10-31 ENCOUNTER — Encounter: Payer: Self-pay | Admitting: Internal Medicine

## 2019-10-31 ENCOUNTER — Other Ambulatory Visit: Payer: Self-pay

## 2019-10-31 ENCOUNTER — Ambulatory Visit (INDEPENDENT_AMBULATORY_CARE_PROVIDER_SITE_OTHER): Payer: Managed Care, Other (non HMO) | Admitting: Internal Medicine

## 2019-10-31 VITALS — BP 130/90 | HR 67 | Temp 97.0°F | Ht 67.0 in | Wt 167.8 lb

## 2019-10-31 DIAGNOSIS — E781 Pure hyperglyceridemia: Secondary | ICD-10-CM | POA: Diagnosis not present

## 2019-10-31 DIAGNOSIS — R079 Chest pain, unspecified: Secondary | ICD-10-CM | POA: Diagnosis not present

## 2019-10-31 DIAGNOSIS — I214 Non-ST elevation (NSTEMI) myocardial infarction: Secondary | ICD-10-CM

## 2019-10-31 DIAGNOSIS — I25118 Atherosclerotic heart disease of native coronary artery with other forms of angina pectoris: Secondary | ICD-10-CM

## 2019-10-31 DIAGNOSIS — I10 Essential (primary) hypertension: Secondary | ICD-10-CM

## 2019-10-31 MED ORDER — ISOSORBIDE MONONITRATE ER 60 MG PO TB24: 60.0000 mg | ORAL_TABLET | Freq: Every day | ORAL | 3 refills | Status: DC

## 2019-10-31 NOTE — Progress Notes (Signed)
Cardiology Office Note:    Date:  10/31/2019   ID:  Andrew Hunter, DOB Aug 07, 1961, MRN [id number]  PCP:  Ladell Pier, MD  Cardiologist:  Elouise Munroe, MD  Electrophysiologist:  None   Referring MD: Ladell Pier, MD  Chief Complaint: chest pain  History of Present Illness:    Andrew Hunter is a 59 y.o. male with a history of NSTEMI recently hospitalized status post FFR guided mid LAD PCI with drug-eluting stent. He also has a 60 to 70% ostial first obtuse marginal branch stenosis that was deemed best for medical therapy. LV function was normal. He was initiated on dual antiplatelet therapy with aspirin and Brilinta for 1 year uninterrupted. He presents for follow up of chest pain, now with stress myoview results.   Stress myoview showed excellent exercise capacity without angina. 10 minutes of exercise, 12 METS.  No ischemia on perfusion images.  Possible prior inferoapical infarct, mild perfusion defect with preserved wall motion.   We discussed test results with interpreter present. No indication for further testing at this time. Chest pain is relieved when he takes off his mask and when he drinks water. He has relief with nitro.   Blood pressure is 147/82. Discussed increasing imdur dose for BP as well as chest pain.   He has had 1 year of DAPT, we can stop brilinta today. Discussed over several visits the need for indefinite aspirin therapy in the absence of bleeding.   Endorses snoring and needs a needs sleep referral.   Past Medical History:  Diagnosis Date   Coronary artery disease    Essential hypertension     Past Surgical History:  Procedure Laterality Date   CORONARY STENT INTERVENTION N/A 10/05/2018   Procedure: CORONARY STENT INTERVENTION;  Surgeon: Lorretta Harp, MD;  Location: [facility];  Service: Cardiovascular;  Laterality: N/A;  MID LAD   INTRAVASCULAR PRESSURE WIRE/FFR STUDY N/A 10/05/2018   Procedure: INTRAVASCULAR PRESSURE  WIRE/FFR STUDY;  Surgeon: Lorretta Harp, MD;  Location: [facility];  Service: Cardiovascular;  Laterality: N/A;  FFR LAD   LEFT HEART CATH AND CORONARY ANGIOGRAPHY N/A 10/05/2018   Procedure: LEFT HEART CATH AND CORONARY ANGIOGRAPHY;  Surgeon: Lorretta Harp, MD;  Location: [facility];  Service: Cardiovascular;  Laterality: N/A;    Current Medications: Current Meds  Medication Sig   acetaminophen (TYLENOL) 500 MG tablet Take 500-1,000 mg by mouth every 6 (six) hours as needed (for pain or headaches).   aspirin EC 81 MG tablet Take 1 tablet (81 mg total) by mouth daily.   atorvastatin (LIPITOR) 40 MG tablet Take 1 tablet (40 mg total) by mouth daily.   calcium carbonate (TUMS - DOSED IN MG ELEMENTAL CALCIUM) 500 MG chewable tablet Chew 1 tablet by mouth daily as needed for indigestion or heartburn.   losartan (COZAAR) 50 MG tablet Take 1 tablet (50 mg total) by mouth daily.   nitroGLYCERIN (NITROSTAT) 0.4 MG SL tablet Place 1 tablet (0.4 mg total) under the tongue every 5 (five) minutes x 3 doses as needed for chest pain.   Omega-3 Fatty Acids (FISH OIL PO) Take 1 capsule by mouth daily.   [DISCONTINUED] isosorbide mononitrate (IMDUR) 30 MG 24 hr tablet Take 1 tablet (30 mg total) by mouth daily with lunch.   [DISCONTINUED] metoprolol tartrate (LOPRESSOR) 25 MG tablet Take 25 mg by mouth. Take 25 mg ( 1 tablet)  in the morning and 50 mg ( 2 tablets)  in the evening   [DISCONTINUED] ticagrelor (BRILINTA) 90 MG TABS tablet Take 1 tablet (90 mg total) by mouth 2 (two) times daily.     Allergies:   Patient has no known allergies.   Social History   Socioeconomic History   Marital status: Single    Spouse name: Not on file   Number of children: Not on file   Years of education: Not on file   Highest education level: Not on file  Occupational History   Not on file  Tobacco Use   Smoking status: Never Smoker   Smokeless tobacco: Never Used  Substance  and Sexual Activity   Alcohol use: Yes    Comment: 2 beers/month   Drug use: Never   Sexual activity: Yes  Other Topics Concern   Not on file  Social History Narrative   Lives locally with wife.  Does not routinely exercise.  Works in Therapist, sports.   Social Determinants of Health   Financial Resource Strain:    Difficulty of Paying Living Expenses:   Food Insecurity:    Worried About Programme researcher, broadcasting/film/video in the Last Year:    Barista in the Last Year:   Transportation Needs:    Freight forwarder (Medical):    Lack of Transportation (Non-Medical):   Physical Activity:    Days of Exercise per Week:    Minutes of Exercise per Session:   Stress:    Feeling of Stress :   Social Connections:    Frequency of Communication with Friends and Family:    Frequency of Social Gatherings with Friends and Family:    Attends Religious Services:    Active Member of Clubs or Organizations:    Attends Banker Meetings:    Marital Status:      Family History: The patient's family history includes Cancer in his mother; Heart attack in his father.  ROS:   Please see the history of present illness.    All other systems reviewed and are negative.  EKGs/Labs/Other Studies Reviewed:    The following studies were reviewed today:  EKG:  n/a  Recent Labs: 05/25/2019: BUN 14; Creatinine, Ser 0.78; Hemoglobin 16.3; Platelets 202; Potassium 4.7; Sodium 137  Recent Lipid Panel    Component Value Date/Time   CHOL 181 10/05/2018 0252   TRIG 167 (H) 10/05/2018 0252   HDL 50 10/05/2018 0252   CHOLHDL 3.6 10/05/2018 0252   VLDL 33 10/05/2018 0252   LDLCALC 98 10/05/2018 0252    Physical Exam:    VS:  BP 130/90    Pulse 67    Temp (!) 97 F (36.1 C)    Ht 5\' 7"  (1.702 m)    Wt 167 lb 12.8 oz (76.1 kg)    SpO2 99%    BMI 26.28 kg/m     Wt Readings from Last 5 Encounters:  10/31/19 167 lb 12.8 oz (76.1 kg)  10/29/19 166 lb (75.3 kg)  10/19/19 166 lb  (75.3 kg)  03/02/19 178 lb 9.6 oz (81 kg)  02/16/19 170 lb 6.4 oz (77.3 kg)     Constitutional: No acute distress Eyes: sclera non-icteric, normal conjunctiva and lids ENMT: normal dentition, moist mucous membranes Cardiovascular: regular rhythm, normal rate, no murmurs. S1 and S2 normal. Radial pulses normal bilaterally. No jugular venous distention.  Respiratory: clear to auscultation bilaterally GI : normal bowel sounds, soft and nontender. No distention.   MSK: extremities warm, well perfused. No edema.  NEURO:  grossly nonfocal exam, moves all extremities. PSYCH: alert and oriented x 3, normal mood and affect.   ASSESSMENT:    1. Chest pain, unspecified type   2. NSTEMI (non-ST elevated myocardial infarction) (HCC)   3. Coronary artery disease of native artery of native heart with stable angina pectoris (HCC)   4. Hypertriglyceridemia   5. Essential hypertension    PLAN:    Chest pain, unspecified type NSTEMI (non-ST elevated myocardial infarction) (HCC) Coronary artery disease of native artery of native heart with stable angina pectoris - nuc with no ischemia. Will uptitrate antianginal therapy, increase imdur to 60 mg daily.  - if continued chest pain can use nitro, cautioned about hypotension.   Hypertriglyceridemia - continue diet and lifestyle modification and atorvastatin. WE discussed it is ok to take atorvastatin 40 mg daily.   Essential hypertension - will increase imdur today and monitor BP.     Total time of encounter: 30 minutes total time of encounter, including 23 minutes spent in face-to-face patient care. This time includes coordination of care and counseling regarding above mentioned problem list. Remainder of non-face-to-face time involved reviewing chart documents/testing relevant to the patient encounter and documentation in the medical record. I have independently reviewed documentation from referring provider.   Weston Brass, MD [place]   [facility]    Medication Adjustments/Labs and Tests Ordered: Current medicines are reviewed at length with the patient today.  Concerns regarding medicines are outlined above.  No orders of the defined types were placed in this encounter.  Meds ordered this encounter  Medications   isosorbide mononitrate (IMDUR) 60 MG 24 hr tablet    Sig: Take 1 tablet (60 mg total) by mouth daily.    Dispense:  90 tablet    Refill:  3    Patient Instructions  Medication Instructions:  Increase Isosorbide to 60 mg daily  Stop Brilinta when you finish   Lab Work: None ordered   Testing/Procedures: None ordered   Follow-Up: At [facility], you and your health needs are our priority.  As part of our continuing mission to provide you with exceptional heart care, we have created designated Provider Care Teams.  These Care Teams include your primary Cardiologist (physician) and Advanced Practice Providers (APPs -  Physician Assistants and Nurse Practitioners) who all work together to provide you with the care you need, when you need it.  We recommend signing up for the patient portal called "MyChart".  Sign up information is provided on this After Visit Summary.  MyChart is used to connect with patients for Virtual Visits (Telemedicine).  Patients are able to view lab/test results, encounter notes, upcoming appointments, etc.  Non-urgent messages can be sent to your provider as well.   To learn more about what you can do with MyChart, go to [email or web address].    Your next appointment:  Thursday 01/31/20 at 11:00 am   The format for your next appointment: Office      Provider:  Dr.Christhoper Busbee    Schedule appointment with [facility] on a sleep clinic day Bring CPAP Machine

## 2019-10-31 NOTE — Patient Instructions (Addendum)
Medication Instructions:  Increase Isosorbide to 60 mg daily  Stop Brilinta when you finish   Lab Work: None ordered   Testing/Procedures: None ordered   Follow-Up: At [facility], you and your health needs are our priority.  As part of our continuing mission to provide you with exceptional heart care, we have created designated Provider Care Teams.  These Care Teams include your primary Cardiologist (physician) and Advanced Practice Providers (APPs -  Physician Assistants and Nurse Practitioners) who all work together to provide you with the care you need, when you need it.  We recommend signing up for the patient portal called "MyChart".  Sign up information is provided on this After Visit Summary.  MyChart is used to connect with patients for Virtual Visits (Telemedicine).  Patients are able to view lab/test results, encounter notes, upcoming appointments, etc.  Non-urgent messages can be sent to your provider as well.   To learn more about what you can do with MyChart, go to [email or web address].    Your next appointment:  Thursday 01/31/20 at 11:00 am   The format for your next appointment: Office      Provider:  Dr.Acharya    Schedule appointment with [facility] on a sleep clinic day Bring CPAP Machine

## 2019-11-16 ENCOUNTER — Other Ambulatory Visit: Payer: Self-pay | Admitting: Internal Medicine

## 2019-11-16 MED FILL — ISOSORBIDE MN ER 30 MG TAB: 30 | 30 days supply | Qty: 30 | Fill #1

## 2019-11-16 MED FILL — ?METOPROLOL TART 25MG TABLE: 25 | 30 days supply | Qty: 90 | Fill #0

## 2019-11-16 MED FILL — LOSARTAN POTASSIUM 50 MG TA: 50 | 30 days supply | Qty: 30 | Fill #7

## 2019-12-13 ENCOUNTER — Other Ambulatory Visit: Payer: Self-pay | Admitting: Internal Medicine

## 2019-12-13 MED FILL — LOSARTAN POTASSIUM 50 MG TA: 50 | 30 days supply | Qty: 30 | Fill #8

## 2019-12-13 MED FILL — ISOSORBIDE MN ER 30 MG TAB: 30 | 30 days supply | Qty: 30 | Fill #2

## 2019-12-13 MED FILL — ?METOPROLOL TART 25MG TABLE: 25 | 20 days supply | Qty: 60 | Fill #0

## 2020-01-14 ENCOUNTER — Other Ambulatory Visit: Payer: Self-pay | Admitting: Internal Medicine

## 2020-01-14 MED FILL — ISOSORBIDE MN ER 30 MG TAB: 30 | 30 days supply | Qty: 30 | Fill #3

## 2020-01-14 MED FILL — LOSARTAN POTASSIUM 50 MG TA: 50 | 30 days supply | Qty: 30 | Fill #9

## 2020-01-14 MED FILL — NITROGLYCERIN 0.4 MG TAB SL: 0.4 | 25 days supply | Qty: 25 | Fill #1

## 2020-01-15 ENCOUNTER — Ambulatory Visit: Payer: Managed Care, Other (non HMO) | Admitting: Cardiovascular Disease

## 2020-01-15 ENCOUNTER — Other Ambulatory Visit: Payer: Self-pay | Admitting: Cardiovascular Disease

## 2020-01-15 ENCOUNTER — Encounter: Payer: Self-pay | Admitting: Cardiovascular Disease

## 2020-01-15 ENCOUNTER — Other Ambulatory Visit: Payer: Self-pay

## 2020-01-15 VITALS — BP 134/82 | HR 77 | Temp 97.9°F | Ht 67.0 in | Wt 170.6 lb

## 2020-01-15 DIAGNOSIS — I214 Non-ST elevation (NSTEMI) myocardial infarction: Secondary | ICD-10-CM | POA: Diagnosis not present

## 2020-01-15 DIAGNOSIS — G4733 Obstructive sleep apnea (adult) (pediatric): Secondary | ICD-10-CM

## 2020-01-15 DIAGNOSIS — E785 Hyperlipidemia, unspecified: Secondary | ICD-10-CM

## 2020-01-15 DIAGNOSIS — I10 Essential (primary) hypertension: Secondary | ICD-10-CM

## 2020-01-15 MED ORDER — METOPROLOL TARTRATE 25 MG PO TABS: ORAL_TABLET | ORAL | 0 refills | Status: DC

## 2020-01-15 MED ORDER — LOSARTAN POTASSIUM 50 MG PO TABS: 50.0000 mg | ORAL_TABLET | Freq: Every day | ORAL | 3 refills | Status: DC

## 2020-01-15 MED ORDER — ISOSORBIDE MONONITRATE ER 30 MG PO TB24: 30.0000 mg | ORAL_TABLET | Freq: Every day | ORAL | 3 refills | Status: DC

## 2020-01-15 MED FILL — METOPROLOL TARTRATE 25 MG T: 25 | 20 days supply | Qty: 60 | Fill #0

## 2020-01-15 NOTE — Patient Instructions (Signed)
Medication Instructions:  CONTINUE WITH CURRENT MEDICATIONS. NO CHANGES.   Follow-Up: At [facility], you and your health needs are our priority.  As part of our continuing mission to provide you with exceptional heart care, we have created designated Provider Care Teams.  These Care Teams include your primary Cardiologist (physician) and Advanced Practice Providers (APPs -  Physician Assistants and Nurse Practitioners) who all work together to provide you with the care you need, when you need it.  We recommend signing up for the patient portal called "MyChart".  Sign up information is provided on this After Visit Summary.  MyChart is used to connect with patients for Virtual Visits (Telemedicine).  Patients are able to view lab/test results, encounter notes, upcoming appointments, etc.  Non-urgent messages can be sent to your provider as well.   To learn more about what you can do with MyChart, go to [email or web address].    Your next appointment:   3 month(s)  The format for your next appointment:   In Person  Provider:   Nicki Guadalajara, MD   Other Instructions Resmed [email or web address] [email or web address]

## 2020-01-21 ENCOUNTER — Encounter: Payer: Self-pay | Admitting: Cardiovascular Disease

## 2020-01-21 NOTE — Progress Notes (Signed)
Cardiology Office Note    Date:  01/21/2020   ID:  Andrew Hunter, DOB [birth year], MRN [id number]  PCP:  Ladell Pier, MD  Cardiologist:  Shelva Majestic, MD (sleep);; Dr Margaretann Loveless  New sleep evaluation for evaluation of obstructive sleep apnea.  History of Present Illness:  Andrew Hunter is a 59 y.o. male who presents to the office today with an interpreter for evaluation of sleep apnea.  Andrew Hunter is an Hispanic male who has known CAD and suffered a non-ST segment elevation MI and underwent PCI to his mid LAD in February 2020.  He also had 60 to 70% ostial stenosis in the first obtuse marginal branch treated medically.  He will establish care with Dr. Margaretann Loveless and was hypertensive.  He had been on losartan 50 mg daily as well as atorvastatin for hyperlipidemia.  Patient states that he believes he had a sleep study over 10 years ago.  He had never initiated CPAP therapy but was told his test was abnormal.  Recently, he has had issues with loud snoring, morning headaches, awakening gasping for breath, he has had significant difficulty sleeping on his back.  He also is aware of loud snoring.  He typically goes to bed between 10 and 11 PM and wakes up at 6 AM.  He has been experiencing nocturia at least 2-3 times per night.   The patient apparently has a friend who had just received a new ResMed air sense 10 CPAP unit and decided not to use it.  Andrew Hunter ended up buying the CPAP machine from his friend for $150.  However he does not have a mask or any other supplies.  Apparently he was told that he needed a sleep evaluation to obtain equipment for his machine.  As result the patient has not used the machine and is to initiate therapy.  He brought the machine with him today and I verify that this is the new ResMed air sense 10 CPAP auto unit.  He continues to be on losartan 50 mg daily, metoprolol 37.5 mg twice a day and isosorbide mononitrate 30 mg daily for hypertension and CAD.  He is on  atorvastatin 40 mg daily for hyperlipidemia.  He presents for initial evaluation.  Past Medical History:  Diagnosis Date  . Coronary artery disease   . Essential hypertension     Past Surgical History:  Procedure Laterality Date  . CORONARY STENT INTERVENTION N/A 10/05/2018   Procedure: CORONARY STENT INTERVENTION;  Surgeon: Lorretta Harp, MD;  Location: [facility];  Service: Cardiovascular;  Laterality: N/A;  MID LAD  . INTRAVASCULAR PRESSURE WIRE/FFR STUDY N/A 10/05/2018   Procedure: INTRAVASCULAR PRESSURE WIRE/FFR STUDY;  Surgeon: Lorretta Harp, MD;  Location: [facility];  Service: Cardiovascular;  Laterality: N/A;  FFR LAD  . LEFT HEART CATH AND CORONARY ANGIOGRAPHY N/A 10/05/2018   Procedure: LEFT HEART CATH AND CORONARY ANGIOGRAPHY;  Surgeon: Lorretta Harp, MD;  Location: [facility];  Service: Cardiovascular;  Laterality: N/A;    Current Medications: Outpatient Medications Prior to Visit  Medication Sig Dispense Refill  . acetaminophen (TYLENOL) 500 MG tablet Take 500-1,000 mg by mouth every 6 (six) hours as needed (for pain or headaches).    Marland Kitchen aspirin EC 81 MG tablet Take 1 tablet (81 mg total) by mouth daily. 90 tablet 3  . atorvastatin (LIPITOR) 40 MG tablet Take 1 tablet (40 mg total) by mouth daily. 90 tablet 3  . calcium  carbonate (TUMS - DOSED IN MG ELEMENTAL CALCIUM) 500 MG chewable tablet Chew 1 tablet by mouth daily as needed for indigestion or heartburn.    . nitroGLYCERIN (NITROSTAT) 0.4 MG SL tablet Place 1 tablet (0.4 mg total) under the tongue every 5 (five) minutes x 3 doses as needed for chest pain. 25 tablet 6  . Omega-3 Fatty Acids (FISH OIL PO) Take 1 capsule by mouth daily.    . isosorbide mononitrate (IMDUR) 30 MG 24 hr tablet Take 30 mg by mouth daily.    Marland Kitchen losartan (COZAAR) 50 MG tablet Take 1 tablet (50 mg total) by mouth daily. 90 tablet 3  . metoprolol tartrate (LOPRESSOR) 25 MG tablet TAKE 1.5 TABLETS (37.5 MG TOTAL) BY  MOUTH 2 (TWO) TIMES DAILY. 60 tablet 0  . isosorbide mononitrate (IMDUR) 60 MG 24 hr tablet Take 1 tablet (60 mg total) by mouth daily. 90 tablet 3   No facility-administered medications prior to visit.     Allergies:   Patient has no known allergies.   Social History   Socioeconomic History  . Marital status: Single    Spouse name: Not on file  . Number of children: Not on file  . Years of education: Not on file  . Highest education level: Not on file  Occupational History  . Not on file  Tobacco Use  . Smoking status: Never Smoker  . Smokeless tobacco: Never Used  Substance and Sexual Activity  . Alcohol use: Yes    Comment: 2 beers/month  . Drug use: Never  . Sexual activity: Yes  Other Topics Concern  . Not on file  Social History Narrative   Lives locally with wife.  Does not routinely exercise.  Works in Engineer, civil (consulting).   [place] Strain:   . Difficulty of Paying Living Expenses:   Food Insecurity:   . Worried About Charity fundraiser in the Last Year:   . Arboriculturist in the Last Year:   Transportation Needs:   . Film/video editor (Medical):   Marland Kitchen Lack of Transportation (Non-Medical):   Physical Activity:   . Days of Exercise per Week:   . Minutes of Exercise per Session:   Stress:   . Feeling of Stress :   Social Connections:   . Frequency of Communication with Friends and Family:   . Frequency of Social Gatherings with Friends and Family:   . Attends Religious Services:   . Active Member of Clubs or Organizations:   . Attends Archivist Meetings:   Marland Kitchen Marital Status:      Family History:  The patient's family history includes Cancer in his mother; Heart attack in his father.   ROS General: Negative; No fevers, chills, or night sweats;  HEENT: Negative; No changes in vision or hearing, sinus congestion, difficulty swallowing Pulmonary: Negative; No cough, wheezing, shortness of breath,  hemoptysis Cardiovascular: Negative; No chest pain, presyncope, syncope, palpitations GI: Negative; No nausea, vomiting, diarrhea, or abdominal pain GU: Negative; No dysuria, hematuria, or difficulty voiding Musculoskeletal: Negative; no myalgias, joint pain, or weakness Hematologic/Oncology: Negative; no easy bruising, bleeding Endocrine: Negative; no heat/cold intolerance; no diabetes Neuro: Negative; no changes in balance, headaches Skin: Negative; No rashes or skin lesions Psychiatric: Negative; No behavioral problems, depression Sleep: Symptom complex highly suggestive for significant sleep apnea with loud snoring, awakening gasping for breath, inability to sleep on his back, nonrestorative sleep, as well as occasional daytime sleepiness.  no bruxism, restless legs, hypnogognic hallucinations, no cataplexy Other comprehensive 14 point system review is negative.   PHYSICAL EXAM:   VS:  BP 134/82   Pulse 77   Temp 97.9 F (36.6 C)   Ht 5' 7"  (1.702 m)   Wt 170 lb 9.6 oz (77.4 kg)   SpO2 97%   BMI 26.72 kg/m     Repeat blood pressure by me 130/84  Wt Readings from Last 3 Encounters:  01/15/20 170 lb 9.6 oz (77.4 kg)  10/31/19 167 lb 12.8 oz (76.1 kg)  10/29/19 166 lb (75.3 kg)    General: Alert, oriented, no distress.  Skin: normal turgor, no rashes, warm and dry HEENT: Normocephalic, atraumatic. Pupils equal round and reactive to light; sclera anicteric; extraocular muscles intact;  Nose without nasal septal hypertrophy Mouth/Parynx benign; Mallinpatti scale Neck: No JVD, no carotid bruits; normal carotid upstroke Lungs: clear to ausculatation and percussion; no wheezing or rales Chest wall: without tenderness to palpitation Heart: PMI not displaced, RRR, s1 s2 normal, 1/6 systolic murmur, no diastolic murmur, no rubs, gallops, thrills, or heaves Abdomen: soft, nontender; no hepatosplenomehaly, BS+; abdominal aorta nontender and not dilated by palpation. Back: no CVA  tenderness Pulses 2+ Musculoskeletal: full range of motion, normal strength, no joint deformities Extremities: no clubbing cyanosis or edema, Homan's sign negative  Neurologic: grossly nonfocal; Cranial nerves grossly wnl Psychologic: Normal mood and affect   Studies/Labs Reviewed:   EKG:  EKG is not ordered today.  I personally reviewed the last ECG from March 02, 2019 which showed normal sinus rhythm at 73, inferior Q waves in 3 and aVF, and incomplete right bundle branch block.  Recent Labs: BMP Latest Ref Rng & Units 05/25/2019 10/06/2018 10/05/2018  Glucose 65 - 99 mg/dL 110(H) 103(H) 96  BUN 8 - 27 mg/dL 14 11 11   Creatinine 0.76 - 1.27 mg/dL 0.78 0.80 0.91  BUN/Creat Ratio 10 - 24 18 - -  Sodium 134 - 144 mmol/L 137 138 140  Potassium 3.5 - 5.2 mmol/L 4.7 3.9 4.4  Chloride 96 - 106 mmol/L 99 105 106  CO2 20 - 29 mmol/L 26 24 17(L)  Calcium 8.6 - 10.2 mg/dL 10.0 9.0 8.8(L)     Hepatic Function Latest Ref Rng & Units 10/16/2018 12/08/2009 09/06/2009  Total Protein 6.0 - 8.5 g/dL 6.9 7.5 7.5  Albumin 3.8 - 4.9 g/dL 4.4 4.5 4.5  AST 0 - 40 IU/L 16 45(H) 51(H)  ALT 0 - 44 IU/L 38 91(H) 90(H)  Alk Phosphatase 39 - 117 IU/L 108 79 90  Total Bilirubin 0.0 - 1.2 mg/dL 1.4(H) 1.7(H) 1.4(H)  Bilirubin, Direct 0.00 - 0.40 mg/dL 0.38 - -    CBC Latest Ref Rng & Units 05/25/2019 10/06/2018 10/05/2018  WBC 3.4 - 10.8 x10E3/uL 6.7 7.2 7.6  Hemoglobin 13.0 - 17.7 g/dL 16.3 14.9 14.6  Hematocrit 37.5 - 51.0 % 47.3 43.0 40.5  Platelets 150 - 450 x10E3/uL 202 188 174   Lab Results  Component Value Date   MCV 96 05/25/2019   MCV 91.7 10/06/2018   MCV 91.8 10/05/2018   No results found for: TSH Lab Results  Component Value Date   HGBA1C 6.1 (H) 05/25/2019     BNP No results found for: BNP  ProBNP No results found for: PROBNP   Lipid Panel     Component Value Date/Time   CHOL 181 10/05/2018 0252   TRIG 167 (H) 10/05/2018 0252   HDL 50 10/05/2018 0252   CHOLHDL 3.6  10/05/2018  0252   VLDL 33 10/05/2018 0252   LDLCALC 98 10/05/2018 0252     RADIOLOGY: No results found.   Additional studies/ records that were reviewed today include:  Reviewed records from Dr. Alveda Reasons as well as his hospitalization in February 2020.  ASSESSMENT:    1. OSA (obstructive sleep apnea)   2. NSTEMI (non-ST elevated myocardial infarction) [facility]): February 2020, DES to LAD   3. Essential hypertension   4. Hyperlipidemia with target LDL less than 70     PLAN:  Andrew Hunter is a very pleasant 59 year old Hispanic gentleman who reportedly was told of having a sleep study over 10 years ago may have had sleep apnea at that time.  Apparently he never obtained a machine.  He has cardiovascular comorbidities and suffered a non-ST segment elevation myocardial infarction in February 2020 requiring intervention to his LAD.  He also had concomitant CAD.  He has a history of hypertension as well as hyperlipidemia.  Recently he has had symptoms highly suggestive of significant sleep apnea with loud snoring, morning headaches, awakening gasping for breath, inability to sleep on his back, frequent awakenings and nocturia at least 2-3 times per night.  I was going to schedule him for a repeat sleep evaluation currently the patient recently bought a new ResMed air sense 10 CPAP auto unit from a personal friend who paid a significantly low price for this equipment.  Presently there is a significant wait list for diagnostic studies.  Since he already has a machine and I feel that he has sleep apnea based on his symptomatology, I have recommended and have prescribed that he obtain a ResMed AirFit N 30i mask.  In the office today we placed an order through [employer] which was having a memorial day special for this excellent mask at a very low price.  I have set his CPAP auto machine for initial trial at a pressure range of 4 up to 15 cm of water.  He will get supplies from [employer].  I had a long discussion with  both he and his interpreter today and reviewed at length the effects of untreated sleep apnea with reference to his cardiovascular health as well as blood pressure control, potential for nocturnal arrhythmias including atrial fibrillation, in addition to nocturnal ischemia in the setting of oxygen desaturation as well as effects on glucose in addition to GERD and inflammation.  Spent over 40 minutes with the patient in the office face-to-face.  I will see him back in the office in several months for reevaluation.   Medication Adjustments/Labs and Tests Ordered: Current medicines are reviewed at length with the patient today.  Concerns regarding medicines are outlined above.  Medication changes, Labs and Tests ordered today are listed in the Patient Instructions below. Patient Instructions  Medication Instructions:  CONTINUE WITH CURRENT MEDICATIONS. NO CHANGES.   Follow-Up: At [facility], you and your health needs are our priority.  As part of our continuing mission to provide you with exceptional heart care, we have created designated Provider Care Teams.  These Care Teams include your primary Cardiologist (physician) and Advanced Practice Providers (APPs -  Physician Assistants and Nurse Practitioners) who all work together to provide you with the care you need, when you need it.  We recommend signing up for the patient portal called "MyChart".  Sign up information is provided on this After Visit Summary.  MyChart is used to connect with patients for Virtual Visits (Telemedicine).  Patients are able to  view lab/test results, encounter notes, upcoming appointments, etc.  Non-urgent messages can be sent to your provider as well.   To learn more about what you can do with MyChart, go to [email or web address].    Your next appointment:   3 month(s)  The format for your next appointment:   In Person  Provider:   Shelva Majestic, MD   Other Instructions Resmed N30i mask [email or web address]       Signed, Shelva Majestic, MD  01/21/2020 2:05 PM    [place] [address], [place], [place], [place]  [address] Phone: [phone]

## 2020-01-31 ENCOUNTER — Ambulatory Visit: Payer: Managed Care, Other (non HMO) | Admitting: Internal Medicine

## 2020-02-13 ENCOUNTER — Other Ambulatory Visit: Payer: Self-pay | Admitting: Cardiovascular Disease

## 2020-02-13 MED FILL — METOPROLOL TARTRATE 25 MG T: 25 | 30 days supply | Qty: 90 | Fill #0

## 2020-02-13 MED FILL — ISOSORBIDE MN ER 30 MG TAB: 30 | 30 days supply | Qty: 30 | Fill #4

## 2020-02-13 MED FILL — ?ATORVASTATIN 40MG TABLET: 40 | 30 days supply | Qty: 30 | Fill #1

## 2020-02-13 MED FILL — LOSARTAN POTASSIUM 50 MG TA: 50 | 30 days supply | Qty: 30 | Fill #10

## 2020-02-14 ENCOUNTER — Encounter: Payer: Self-pay | Admitting: General Practice

## 2020-02-28 ENCOUNTER — Encounter: Payer: Self-pay | Admitting: *Deleted

## 2020-03-18 MED FILL — ISOSORBIDE MN ER 30 MG TAB: 30 | 30 days supply | Qty: 30 | Fill #5

## 2020-03-18 MED FILL — LOSARTAN POTASSIUM 50 MG TA: 50 | 30 days supply | Qty: 30 | Fill #0

## 2020-03-18 MED FILL — METOPROLOL TARTRATE 25 MG T: 25 | 30 days supply | Qty: 90 | Fill #1

## 2020-04-16 ENCOUNTER — Ambulatory Visit: Payer: Managed Care, Other (non HMO) | Admitting: Cardiovascular Disease

## 2020-04-16 ENCOUNTER — Other Ambulatory Visit: Payer: Self-pay

## 2020-04-16 VITALS — BP 132/76 | HR 71 | Ht 67.0 in | Wt 164.2 lb

## 2020-04-16 DIAGNOSIS — G4733 Obstructive sleep apnea (adult) (pediatric): Secondary | ICD-10-CM | POA: Diagnosis not present

## 2020-04-16 NOTE — Progress Notes (Signed)
Cardiology Office Note:    Date:  04/18/2020   ID:  Andrew Hunter, DOB [birth year], MRN [id number]  PCP:  Marcine Matar, MD  Cardiologist:  Parke Poisson, MD  Electrophysiologist:  None   Referring MD: Marcine Matar, MD   Chief Complaint: CAD, prior NSTEMI  History of Present Illness:    Andrew Hunter is a 59 y.o. male with a history of NSTEMI s/p PCI to mid LAD and residual first OM 60-70% stenosis felt best for GDMT.   Following with Dr. Tresa Endo for OSA, CPAP order in progress.   He feels well today.  He has occasional chest pain and has had to take nitroglycerin only twice since our last appointment in March.  He does note occasional chest pain related to stress on the job, but his employer is supportive and allows him to take a break, take medications and rest when this occurs.  His son now works in the same field as him and is covering for him today.  Past Medical History:  Diagnosis Date  . Coronary artery disease   . Essential hypertension     Past Surgical History:  Procedure Laterality Date  . CORONARY STENT INTERVENTION N/A 10/05/2018   Procedure: CORONARY STENT INTERVENTION;  Surgeon: Runell Gess, MD;  [facility]: [facility];  Service: Cardiovascular;  Laterality: N/A;  MID LAD  . INTRAVASCULAR PRESSURE WIRE/FFR STUDY N/A 10/05/2018   Procedure: INTRAVASCULAR PRESSURE WIRE/FFR STUDY;  Surgeon: Runell Gess, MD;  Location: [facility];  Service: Cardiovascular;  Laterality: N/A;  FFR LAD  . LEFT HEART CATH AND CORONARY ANGIOGRAPHY N/A 10/05/2018   Procedure: LEFT HEART CATH AND CORONARY ANGIOGRAPHY;  Surgeon: Runell Gess, MD;  Location: [facility];  Service: Cardiovascular;  Laterality: N/A;    Current Medications: Current Meds  Medication Sig  . acetaminophen (TYLENOL) 500 MG tablet Take 500-1,000 mg by mouth every 6 (six) hours as needed (for pain or headaches).  Marland Kitchen aspirin EC 81 MG tablet Take 1 tablet (81 mg total) by  mouth daily.  Marland Kitchen atorvastatin (LIPITOR) 40 MG tablet Take 1 tablet (40 mg total) by mouth daily.  . calcium carbonate (TUMS - DOSED IN MG ELEMENTAL CALCIUM) 500 MG chewable tablet Chew 1 tablet by mouth daily as needed for indigestion or heartburn.   . cholecalciferol (VITAMIN D3) 25 MCG (1000 UNIT) tablet Take 1,000 Units by mouth daily.  . isosorbide mononitrate (IMDUR) 30 MG 24 hr tablet Take 1 tablet (30 mg total) by mouth daily.  . metoprolol tartrate (LOPRESSOR) 25 MG tablet TAKE 1.5 TABLETS (37.5 MG TOTAL) BY MOUTH 2 (TWO) TIMES DAILY.  . nitroGLYCERIN (NITROSTAT) 0.4 MG SL tablet Place 1 tablet (0.4 mg total) under the tongue every 5 (five) minutes x 3 doses as needed for chest pain.  . Omega-3 Fatty Acids (FISH OIL PO) Take 1 capsule by mouth daily.     Allergies:   Patient has no known allergies.   Social History   Socioeconomic History  . Marital status: Single    Spouse name: Not on file  . Number of children: Not on file  . Years of education: Not on file  . Highest education level: Not on file  Occupational History  . Not on file  Tobacco Use  . Smoking status: Never Smoker  . Smokeless tobacco: Never Used  Vaping Use  . Vaping Use: Never used  Substance and Sexual Activity  . Alcohol use: Yes  Comment: 2 beers/month  . Drug use: Never  . Sexual activity: Yes  Other Topics Concern  . Not on file  Social History Narrative   Lives locally with wife.  Does not routinely exercise.  Works in Therapist, sports.   Social Determinants of Health   Financial Resource Strain:   . Difficulty of Paying Living Expenses: Not on file  Food Insecurity:   . Worried About Programme researcher, broadcasting/film/video in the Last Year: Not on file  . Ran Out of Food in the Last Year: Not on file  Transportation Needs:   . Lack of Transportation (Medical): Not on file  . Lack of Transportation (Non-Medical): Not on file  Physical Activity:   . Days of Exercise per Week: Not on file  . Minutes of Exercise  per Session: Not on file  Stress:   . Feeling of Stress : Not on file  Social Connections:   . Frequency of Communication with Friends and Family: Not on file  . Frequency of Social Gatherings with Friends and Family: Not on file  . Attends Religious Services: Not on file  . Active Member of Clubs or Organizations: Not on file  . Attends Banker Meetings: Not on file  . Marital Status: Not on file     Family History: The patient's family history includes Cancer in his mother; Heart attack in his father.  ROS:   Please see the history of present illness.    All other systems reviewed and are negative.  EKGs/Labs/Other Studies Reviewed:    The following studies were reviewed today:  EKG:  NSR, iRBBB QRS duration 116 ms  Recent Labs: 05/25/2019: BUN 14; Creatinine, Ser 0.78; Hemoglobin 16.3; Platelets 202; Potassium 4.7; Sodium 137  Recent Lipid Panel    Component Value Date/Time   CHOL 181 10/05/2018 0252   TRIG 167 (H) 10/05/2018 0252   HDL 50 10/05/2018 0252   CHOLHDL 3.6 10/05/2018 0252   VLDL 33 10/05/2018 0252   LDLCALC 98 10/05/2018 0252    Physical Exam:    VS:  BP 122/80   Pulse 76   Temp (!) 97 F (36.1 C)   Ht 5\' 7"  (1.702 m)   Wt 165 lb 3.2 oz (74.9 kg)   SpO2 98%   BMI 25.87 kg/m     Wt Readings from Last 5 Encounters:  04/18/20 165 lb 3.2 oz (74.9 kg)  04/16/20 164 lb 3.2 oz (74.5 kg)  01/15/20 170 lb 9.6 oz (77.4 kg)  10/31/19 167 lb 12.8 oz (76.1 kg)  10/29/19 166 lb (75.3 kg)     Constitutional: No acute distress Eyes: sclera non-icteric, normal conjunctiva and lids ENMT: normal dentition, moist mucous membranes Cardiovascular: regular rhythm, normal rate, no murmurs. S1 and S2 normal. Radial pulses normal bilaterally. No jugular venous distention.  Respiratory: clear to auscultation bilaterally GI : normal bowel sounds, soft and nontender. No distention.   MSK: extremities warm, well perfused. No edema.  NEURO: grossly  nonfocal exam, moves all extremities. PSYCH: alert and oriented x 3, normal mood and affect.   ASSESSMENT:    1. NSTEMI (non-ST elevated myocardial infarction) [facility]): February 2020, DES to LAD   2. Coronary artery disease of native artery of native heart with stable angina pectoris (HCC)   3. Essential hypertension   4. Hyperlipidemia with target LDL less than 70   5. OSA (obstructive sleep apnea)   6. Prediabetes   7. Medication management    PLAN:  NSTEMI (non-ST elevated myocardial infarction) [facility]): February 2020, DES to LAD Coronary artery disease of native artery of native heart with stable angina pectoris (HCC) - continue ASA 81 mg daily, atorvastatin 40 mg daily, Imdur 30 mg daily, metoprolol tartrate 25 mg BID, prn nitro - normal EF on echo at time of MI, no findings requiring follow up at this time.   Essential hypertension - continue meds as above, as well as losartan 50 mg daily   Hyperlipidemia with target LDL less than 70 - atorvastatin 40 mg daily, will repeat lipid panel today since it has not been checked since his MI, and will ensure triglycerides are improving.   OSA (obstructive sleep apnea)  - undergoing workup with Dr. Tresa Endo, mask ordered. Monitor response.  Cardiovascular risk reduction - weight decreased from last visit.   LABS NEEDED: LIPIDS, HBA1C, CMP related to medication use.   Total time of encounter: 30 minutes total time of encounter, including 25 minutes spent in face-to-face patient care on the date of this encounter. This time includes coordination of care and counseling regarding above mentioned problem list. Remainder of non-face-to-face time involved reviewing chart documents/testing relevant to the patient encounter and documentation in the medical record. I have independently reviewed documentation from referring provider.   Weston Brass, MD [place]  [facility]    Medication Adjustments/Labs and Tests Ordered: Current  medicines are reviewed at length with the patient today.  Concerns regarding medicines are outlined above.  Orders Placed This Encounter  Procedures  . Lipid panel  . Comprehensive metabolic panel  . Hemoglobin A1c   No orders of the defined types were placed in this encounter.   Patient Instructions  Medication Instructions:  Your Physician recommend you continue on your current medication as directed.    *If you need a refill on your cardiac medications before your next appointment, please call your pharmacy*   Lab Work: Your physician recommends that you return for lab work in 1 month ( Lipid, CMP, A1C)   If you have labs (blood work) drawn today and your tests are completely normal, you will receive your results only by: Marland Kitchen MyChart Message (if you have MyChart) OR . A paper copy in the mail If you have any lab test that is abnormal or we need to change your treatment, we will call you to review the results.   Testing/Procedures: None ordered   Follow-Up: At [facility], you and your health needs are our priority.  As part of our continuing mission to provide you with exceptional heart care, we have created designated Provider Care Teams.  These Care Teams include your primary Cardiologist (physician) and Advanced Practice Providers (APPs -  Physician Assistants and Nurse Practitioners) who all work together to provide you with the care you need, when you need it.  We recommend signing up for the patient portal called "MyChart".  Sign up information is provided on this After Visit Summary.  MyChart is used to connect with patients for Virtual Visits (Telemedicine).  Patients are able to view lab/test results, encounter notes, upcoming appointments, etc.  Non-urgent messages can be sent to your provider as well.   To learn more about what you can do with MyChart, go to [email or web address].    Your next appointment:   6 month(s)  The format for your next appointment:     In Person  Provider:   Weston Brass, MD

## 2020-04-16 NOTE — Patient Instructions (Signed)

## 2020-04-16 NOTE — Progress Notes (Signed)
Cardiology Office Note    Date:  04/16/2020   ID:  Andrew, Hunter Jun 24, 1961, MRN [id number]  PCP:  Ladell Pier, MD  Cardiologist:  Shelva Majestic, MD (sleep);; Dr Margaretann Loveless  F/U sleep evaluation for evaluation of obstructive sleep apnea.  History of Present Illness:  Andrew Hunter is a 59 y.o. male who presents to the office today with an interpreter for f/u evaluation of sleep apnea.  Andrew Hunter is an Hispanic male who has known CAD and suffered a non-ST segment elevation MI and underwent PCI to his mid LAD in February 2020.  He also had 60 to 70% ostial stenosis in the first obtuse marginal branch treated medically.  He will establish care with Dr. Margaretann Loveless and was hypertensive.  He had been on losartan 50 mg daily as well as atorvastatin for hyperlipidemia.  Patient states that he believes he had a sleep study over 10 years ago.  He had never initiated CPAP therapy but was told his test was abnormal.  Recently, he has had issues with loud snoring, morning headaches, awakening gasping for breath, he has had significant difficulty sleeping on his back.  He also is aware of loud snoring.  He typically goes to bed between 10 and 11 PM and wakes up at 6 AM.  He has been experiencing nocturia at least 2-3 times per night.   The patient apparently has a friend who had just received a new ResMed air sense 10 CPAP unit and decided not to use it.  Andrew Hunter ended up buying the CPAP machine from his friend for $150.  However he does not have a mask or any other supplies.  Apparently he was told that he needed a sleep evaluation to obtain equipment for his machine.  As result the patient has not used the machine and is to initiate therapy.  I saw him for initial evaluation with me on Jan 15, 2020.  He brought the machine with him today and I verify that this is the new ResMed air sense 10 CPAP auto unit.  He continued to be on losartan 50 mg daily, metoprolol 37.5 mg twice a day and  isosorbide mononitrate 30 mg daily for hypertension and CAD.  He is on atorvastatin 40 mg daily for hyperlipidemia.    At his initial evaluation, with his symptoms highly suggestive of significant sleep apnea with loud snoring, morning headaches, awakening gasping for breath, inability to sleep on his back, frequent awakenings and nocturia 2-3 times per week I was going to schedule him for a repeat sleep evaluation.  However since he had bought a new ResMed air sense 10 CPAP auto machine from a friend during that evaluation I sent his CPAP auto machine for an initial trial at a pressure range of 4 up to 15 cm of water.  Since he did not have a DME company we gave him information to obtain mask from [email or web address].  He ordered a ResMed AirFit N 30i mask at the end of that office visit.  He tells me today that apparently he never received the mask from [email or web address].  As result he has not been able to initiate CPAP.  He does feel that he has been sleeping better and typically goes to bed at 11 PM and wakes up at 6 AM for 7 hours duration of sleep.  He denies any recent morning headaches.  He presents for evaluation.  Past Medical History:  Diagnosis Date  . Coronary artery disease   .  Essential hypertension     Past Surgical History:  Procedure Laterality Date  . CORONARY STENT INTERVENTION N/A 10/05/2018   Procedure: CORONARY STENT INTERVENTION;  Surgeon: Lorretta Harp, MD;  Location: [facility];  Service: Cardiovascular;  Laterality: N/A;  MID LAD  . INTRAVASCULAR PRESSURE WIRE/FFR STUDY N/A 10/05/2018   Procedure: INTRAVASCULAR PRESSURE WIRE/FFR STUDY;  Surgeon: Lorretta Harp, MD;  Location: [facility];  Service: Cardiovascular;  Laterality: N/A;  FFR LAD  . LEFT HEART CATH AND CORONARY ANGIOGRAPHY N/A 10/05/2018   Procedure: LEFT HEART CATH AND CORONARY ANGIOGRAPHY;  Surgeon: Lorretta Harp, MD;  Location: [facility];  Service: Cardiovascular;  Laterality: N/A;    Current  Medications: Outpatient Medications Prior to Visit  Medication Sig Dispense Refill  . acetaminophen (TYLENOL) 500 MG tablet Take 500-1,000 mg by mouth every 6 (six) hours as needed (for pain or headaches).    Marland Kitchen aspirin EC 81 MG tablet Take 1 tablet (81 mg total) by mouth daily. 90 tablet 3  . atorvastatin (LIPITOR) 40 MG tablet Take 1 tablet (40 mg total) by mouth daily. 90 tablet 3  . calcium carbonate (TUMS - DOSED IN MG ELEMENTAL CALCIUM) 500 MG chewable tablet Chew 1 tablet by mouth daily as needed for indigestion or heartburn.    . isosorbide mononitrate (IMDUR) 30 MG 24 hr tablet Take 1 tablet (30 mg total) by mouth daily. 90 tablet 3  . losartan (COZAAR) 50 MG tablet Take 1 tablet (50 mg total) by mouth daily. 90 tablet 3  . metoprolol tartrate (LOPRESSOR) 25 MG tablet TAKE 1.5 TABLETS (37.5 MG TOTAL) BY MOUTH 2 (TWO) TIMES DAILY. 90 tablet 5  . nitroGLYCERIN (NITROSTAT) 0.4 MG SL tablet Place 1 tablet (0.4 mg total) under the tongue every 5 (five) minutes x 3 doses as needed for chest pain. 25 tablet 6  . Omega-3 Fatty Acids (FISH OIL PO) Take 1 capsule by mouth daily.     No facility-administered medications prior to visit.     Allergies:   Patient has no known allergies.   Social History   Socioeconomic History  . Marital status: Single    Spouse name: Not on file  . Number of children: Not on file  . Years of education: Not on file  . Highest education level: Not on file  Occupational History  . Not on file  Tobacco Use  . Smoking status: Never Smoker  . Smokeless tobacco: Never Used  Vaping Use  . Vaping Use: Never used  Substance and Sexual Activity  . Alcohol use: Yes    Comment: 2 beers/month  . Drug use: Never  . Sexual activity: Yes  Other Topics Concern  . Not on file  Social History Narrative   Lives locally with wife.  Does not routinely exercise.  Works in Engineer, civil (consulting).   Social Determinants of [place] Strain:   . Difficulty of  Paying Living Expenses: Not on file  Food Insecurity:   . Worried About Charity fundraiser in the Last Year: Not on file  . Ran Out of Food in the Last Year: Not on file  Transportation Needs:   . Lack of Transportation (Medical): Not on file  . Lack of Transportation (Non-Medical): Not on file  Physical Activity:   . Days of Exercise per Week: Not on file  . Minutes of Exercise per Session: Not on file  Stress:   . Feeling of Stress :  Not on file  Social Connections:   . Frequency of Communication with Friends and Family: Not on file  . Frequency of Social Gatherings with Friends and Family: Not on file  . Attends Religious Services: Not on file  . Active Member of Clubs or Organizations: Not on file  . Attends Archivist Meetings: Not on file  . Marital Status: Not on file     Family History:  The patient's family history includes Cancer in his mother; Heart attack in his father.   ROS General: Negative; No fevers, chills, or night sweats;  HEENT: Negative; No changes in vision or hearing, sinus congestion, difficulty swallowing Pulmonary: Negative; No cough, wheezing, shortness of breath, hemoptysis Cardiovascular: Negative; No chest pain, presyncope, syncope, palpitations GI: Negative; No nausea, vomiting, diarrhea, or abdominal pain GU: Negative; No dysuria, hematuria, or difficulty voiding Musculoskeletal: Negative; no myalgias, joint pain, or weakness Hematologic/Oncology: Negative; no easy bruising, bleeding Endocrine: Negative; no heat/cold intolerance; no diabetes Neuro: Negative; no changes in balance, headaches Skin: Negative; No rashes or skin lesions Psychiatric: Negative; No behavioral problems, depression Sleep: Symptom complex highly suggestive for significant sleep apnea with loud snoring, awakening gasping for breath, inability to sleep on his back, nonrestorative sleep, as well as occasional daytime sleepiness.  no bruxism, restless legs, hypnogognic  hallucinations, no cataplexy Other comprehensive 14 point system review is negative.   PHYSICAL EXAM:   VS:  There were no vitals taken for this visit.    Repeat blood pressure by me was 130/74  Wt Readings from Last 3 Encounters:  01/15/20 170 lb 9.6 oz (77.4 kg)  10/31/19 167 lb 12.8 oz (76.1 kg)  10/29/19 166 lb (75.3 kg)    General: Alert, oriented, no distress.  Skin: normal turgor, no rashes, warm and dry HEENT: Normocephalic, atraumatic. Pupils equal round and reactive to light; sclera anicteric; extraocular muscles intact;  Nose without nasal septal hypertrophy Mouth/Parynx benign; Mallinpatti scale 3 Neck: No JVD, no carotid bruits; normal carotid upstroke Lungs: clear to ausculatation and percussion; no wheezing or rales Chest wall: without tenderness to palpitation Heart: PMI not displaced, RRR, s1 s2 normal, 1/6 systolic murmur, no diastolic murmur, no rubs, gallops, thrills, or heaves Abdomen: soft, nontender; no hepatosplenomehaly, BS+; abdominal aorta nontender and not dilated by palpation. Back: no CVA tenderness Pulses 2+ Musculoskeletal: full range of motion, normal strength, no joint deformities Extremities: no clubbing cyanosis or edema, Homan's sign negative  Neurologic: grossly nonfocal; Cranial nerves grossly wnl Psychologic: Normal mood and affect   Studies/Labs Reviewed:   ECG (independently read by me): NSR at 71; Mild RV conduction delay       I personally reviewed the last ECG from March 02, 2019 which showed normal sinus rhythm at 73, inferior Q waves in 3 and aVF, and incomplete right bundle branch block.  Recent Labs: BMP Latest Ref Rng & Units 05/25/2019 10/06/2018 10/05/2018  Glucose 65 - 99 mg/dL 110(H) 103(H) 96  BUN 8 - 27 mg/dL 14 11 11   Creatinine 0.76 - 1.27 mg/dL 0.78 0.80 0.91  BUN/Creat Ratio 10 - 24 18 - -  Sodium 134 - 144 mmol/L 137 138 140  Potassium 3.5 - 5.2 mmol/L 4.7 3.9 4.4  Chloride 96 - 106 mmol/L 99 105 106  CO2 20 - 29  mmol/L 26 24 17(L)  Calcium 8.6 - 10.2 mg/dL 10.0 9.0 8.8(L)     Hepatic Function Latest Ref Rng & Units 10/16/2018 12/08/2009 09/06/2009  Total Protein 6.0 - 8.5 g/dL  6.9 7.5 7.5  Albumin 3.8 - 4.9 g/dL 4.4 4.5 4.5  AST 0 - 40 IU/L 16 45(H) 51(H)  ALT 0 - 44 IU/L 38 91(H) 90(H)  Alk Phosphatase 39 - 117 IU/L 108 79 90  Total Bilirubin 0.0 - 1.2 mg/dL 1.4(H) 1.7(H) 1.4(H)  Bilirubin, Direct 0.00 - 0.40 mg/dL 0.38 - -    CBC Latest Ref Rng & Units 05/25/2019 10/06/2018 10/05/2018  WBC 3.4 - 10.8 x10E3/uL 6.7 7.2 7.6  Hemoglobin 13.0 - 17.7 g/dL 16.3 14.9 14.6  Hematocrit 37.5 - 51.0 % 47.3 43.0 40.5  Platelets 150 - 450 x10E3/uL 202 188 174   Lab Results  Component Value Date   MCV 96 05/25/2019   MCV 91.7 10/06/2018   MCV 91.8 10/05/2018   No results found for: TSH Lab Results  Component Value Date   HGBA1C 6.1 (H) 05/25/2019     BNP No results found for: BNP  ProBNP No results found for: PROBNP   Lipid Panel     Component Value Date/Time   CHOL 181 10/05/2018 0252   TRIG 167 (H) 10/05/2018 0252   HDL 50 10/05/2018 0252   CHOLHDL 3.6 10/05/2018 0252   VLDL 33 10/05/2018 0252   LDLCALC 98 10/05/2018 0252     RADIOLOGY: No results found.   Additional studies/ records that were reviewed today include:  Reviewed records from Dr. Alveda Reasons as well as his hospitalization in February 2020.  ASSESSMENT:    No diagnosis found.  PLAN:  Mr. Dyshon Philbin is a very pleasant 59 year-old Hispanic gentleman who reportedly was told of having a sleep study over 10 years ago may have had sleep apnea at that time.  Apparently he never obtained a machine.  He has cardiovascular comorbidities and suffered a non-ST segment elevation myocardial infarction in February 2020 requiring intervention to his LAD.  He also had concomitant CAD and also has cardiovascular risk factors including hypertension as well as hyperlipidemia.  When I initially evaluated him in May 2021 he had just  purchased a ResMed air sense 10 CPAP auto unit from a friend who would essentially never used his machine.  As result during that evaluation I sent his CPAP auto pressure range from 4-15.  He had purchased the ResMed air fit N 30i mask but apparently there was some difficulty in fact her office had never received the information for the prescription.  In the office today, I provided him with 2 masks for him to have his samples.  We did not have the N 30i but had the ResMed AirFit N 30 mask with the only difference being the tubing coming to the front as opposed to originating from the crown of his head.  In addition I provided him with samples of the Respironics dream wisp mask.  He will try these new mask in the interim, and he will contact [email or web address] so that a prescription can be sent in.  His blood pressure today is stable.  He is not having any anginal symptoms and continues to be on isosorbide 30 mg, losartan 50 mg and metoprolol tartrate 37.5 mg twice a day.Marland Kitchen  He continues to be on atorvastatin 40 mg for hyperlipidemia.  I will see him in 6 months for follow-up evaluation    Medication Adjustments/Labs and Tests Ordered: Current medicines are reviewed at length with the patient today.  Concerns regarding medicines are outlined above.  Medication changes, Labs and Tests ordered today are listed in the Patient Instructions below. There  are no Patient Instructions on file for this visit.   Signed, Shelva Majestic, MD  04/16/2020 8:14 AM    [facility] [address] [place], [place], [address] [place]  [address] Phone: [phone]

## 2020-04-17 MED FILL — LOSARTAN POTASSIUM 50 MG TA: 50 | 30 days supply | Qty: 30 | Fill #1

## 2020-04-17 MED FILL — METOPROLOL TARTRATE 25 MG T: 25 | 30 days supply | Qty: 90 | Fill #2

## 2020-04-17 MED FILL — ISOSORBIDE MN ER 30 MG TAB: 30 | 30 days supply | Qty: 30 | Fill #6

## 2020-04-18 ENCOUNTER — Other Ambulatory Visit: Payer: Self-pay

## 2020-04-18 ENCOUNTER — Encounter: Payer: Self-pay | Admitting: Internal Medicine

## 2020-04-18 ENCOUNTER — Encounter: Payer: Self-pay | Admitting: Cardiovascular Disease

## 2020-04-18 ENCOUNTER — Ambulatory Visit: Payer: Managed Care, Other (non HMO) | Admitting: Internal Medicine

## 2020-04-18 VITALS — BP 122/80 | HR 76 | Temp 97.0°F | Ht 67.0 in | Wt 165.2 lb

## 2020-04-18 DIAGNOSIS — I25118 Atherosclerotic heart disease of native coronary artery with other forms of angina pectoris: Secondary | ICD-10-CM

## 2020-04-18 DIAGNOSIS — R7303 Prediabetes: Secondary | ICD-10-CM

## 2020-04-18 DIAGNOSIS — I214 Non-ST elevation (NSTEMI) myocardial infarction: Secondary | ICD-10-CM | POA: Diagnosis not present

## 2020-04-18 DIAGNOSIS — Z79899 Other long term (current) drug therapy: Secondary | ICD-10-CM

## 2020-04-18 DIAGNOSIS — I10 Essential (primary) hypertension: Secondary | ICD-10-CM | POA: Diagnosis not present

## 2020-04-18 DIAGNOSIS — E785 Hyperlipidemia, unspecified: Secondary | ICD-10-CM

## 2020-04-18 DIAGNOSIS — G4733 Obstructive sleep apnea (adult) (pediatric): Secondary | ICD-10-CM

## 2020-04-18 NOTE — Patient Instructions (Signed)
Medication Instructions:  Your Physician recommend you continue on your current medication as directed.    *If you need a refill on your cardiac medications before your next appointment, please call your pharmacy*   Lab Work: Your physician recommends that you return for lab work in 1 month ( Lipid, CMP, A1C)   If you have labs (blood work) drawn today and your tests are completely normal, you will receive your results only by: Marland Kitchen MyChart Message (if you have MyChart) OR . A paper copy in the mail If you have any lab test that is abnormal or we need to change your treatment, we will call you to review the results.   Testing/Procedures: None ordered   Follow-Up: At [facility], you and your health needs are our priority.  As part of our continuing mission to provide you with exceptional heart care, we have created designated Provider Care Teams.  These Care Teams include your primary Cardiologist (physician) and Advanced Practice Providers (APPs -  Physician Assistants and Nurse Practitioners) who all work together to provide you with the care you need, when you need it.  We recommend signing up for the patient portal called "MyChart".  Sign up information is provided on this After Visit Summary.  MyChart is used to connect with patients for Virtual Visits (Telemedicine).  Patients are able to view lab/test results, encounter notes, upcoming appointments, etc.  Non-urgent messages can be sent to your provider as well.   To learn more about what you can do with MyChart, go to [email or web address].    Your next appointment:   6 month(s)  The format for your next appointment:   In Person  Provider:   Weston Brass, MD

## 2020-05-15 MED FILL — LOSARTAN POTASSIUM 50 MG TA: 50 | 30 days supply | Qty: 30 | Fill #2

## 2020-05-15 MED FILL — ISOSORBIDE MN ER 30 MG TAB: 30 | 30 days supply | Qty: 30 | Fill #7

## 2020-05-15 MED FILL — METOPROLOL TARTRATE 25 MG T: 25 | 30 days supply | Qty: 90 | Fill #3

## 2020-06-12 MED FILL — LOSARTAN POTASSIUM 50 MG TA: 50 | 30 days supply | Qty: 30 | Fill #3

## 2020-06-12 MED FILL — METOPROLOL TARTRATE 25 MG T: 25 | 30 days supply | Qty: 90 | Fill #4

## 2020-06-12 MED FILL — ISOSORBIDE MN ER 30 MG TAB: 30 | 30 days supply | Qty: 30 | Fill #8

## 2020-07-11 MED FILL — NITROGLYCERIN 0.4 MG TAB SL: 0.4 | 25 days supply | Qty: 25 | Fill #2

## 2020-07-11 MED FILL — ISOSORBIDE MN ER 30 MG TAB: 30 | 30 days supply | Qty: 30 | Fill #9

## 2020-07-11 MED FILL — METOPROLOL TARTRATE 25 MG T: 25 | 30 days supply | Qty: 90 | Fill #5

## 2020-07-11 MED FILL — LOSARTAN POTASSIUM 50 MG TA: 50 | 30 days supply | Qty: 30 | Fill #4

## 2020-07-25 ENCOUNTER — Other Ambulatory Visit: Payer: Managed Care, Other (non HMO)

## 2020-07-25 DIAGNOSIS — Z20822 Contact with and (suspected) exposure to covid-19: Secondary | ICD-10-CM

## 2020-07-26 LAB — SARS-COV-2, NAA 2 DAY TAT

## 2020-07-26 LAB — NOVEL CORONAVIRUS, NAA: SARS-CoV-2, NAA: NOT DETECTED

## 2020-08-07 ENCOUNTER — Other Ambulatory Visit: Payer: Self-pay | Admitting: Internal Medicine

## 2020-08-07 ENCOUNTER — Ambulatory Visit: Payer: Managed Care, Other (non HMO) | Attending: Internal Medicine | Admitting: Internal Medicine

## 2020-08-07 ENCOUNTER — Other Ambulatory Visit: Payer: Self-pay

## 2020-08-07 ENCOUNTER — Encounter: Payer: Self-pay | Admitting: Internal Medicine

## 2020-08-07 VITALS — BP 132/74 | HR 74 | Temp 98.2°F | Resp 16 | Ht 67.0 in | Wt 171.0 lb

## 2020-08-07 DIAGNOSIS — N401 Enlarged prostate with lower urinary tract symptoms: Secondary | ICD-10-CM

## 2020-08-07 DIAGNOSIS — Z114 Encounter for screening for human immunodeficiency virus [HIV]: Secondary | ICD-10-CM

## 2020-08-07 DIAGNOSIS — E663 Overweight: Secondary | ICD-10-CM | POA: Diagnosis not present

## 2020-08-07 DIAGNOSIS — H6123 Impacted cerumen, bilateral: Secondary | ICD-10-CM

## 2020-08-07 DIAGNOSIS — Z2821 Immunization not carried out because of patient refusal: Secondary | ICD-10-CM

## 2020-08-07 DIAGNOSIS — I25118 Atherosclerotic heart disease of native coronary artery with other forms of angina pectoris: Secondary | ICD-10-CM

## 2020-08-07 DIAGNOSIS — Z1159 Encounter for screening for other viral diseases: Secondary | ICD-10-CM

## 2020-08-07 DIAGNOSIS — R7303 Prediabetes: Secondary | ICD-10-CM | POA: Diagnosis not present

## 2020-08-07 DIAGNOSIS — Z0000 Encounter for general adult medical examination without abnormal findings: Secondary | ICD-10-CM

## 2020-08-07 DIAGNOSIS — I10 Essential (primary) hypertension: Secondary | ICD-10-CM | POA: Diagnosis not present

## 2020-08-07 DIAGNOSIS — G4733 Obstructive sleep apnea (adult) (pediatric): Secondary | ICD-10-CM

## 2020-08-07 DIAGNOSIS — Z9989 Dependence on other enabling machines and devices: Secondary | ICD-10-CM

## 2020-08-07 DIAGNOSIS — Z1211 Encounter for screening for malignant neoplasm of colon: Secondary | ICD-10-CM

## 2020-08-07 DIAGNOSIS — N3941 Urge incontinence: Secondary | ICD-10-CM | POA: Insufficient documentation

## 2020-08-07 MED ORDER — TAMSULOSIN HCL 0.4 MG PO CAPS: 0.4000 mg | ORAL_CAPSULE | Freq: Every day | ORAL | 3 refills | Status: DC

## 2020-08-07 MED ORDER — DEBROX 6.5 % OT SOLN: 5.0000 [drp] | Freq: Two times a day (BID) | OTIC | 0 refills | Status: DC

## 2020-08-07 NOTE — Progress Notes (Signed)
Patient ID: Andrew SenderRoque Hunter, male    DOB: Oct 11, 1960  MRN: [id number]  CC: Annual Exam   Subjective: Andrew Hunter is a 59 y.o. male who presents for chronic ds management/physical.  Last eval 04/2019.  Patient speaks English and did not require an interpreter. His concerns today include:  Patient with history of HTN, preDM, CAD,GERD, OSA CPAP order in process by cardiology  PreDM:  Walking on treadmill for 30 mins and rides his stationary bicylcle 30 mins 5 days/wk Eating good -he does not eat greasy foods.  Eating veggies and fruits, eats a variety of meats.  Does not drink sugary drinks.  He uses [place] and is tea/coffee  HYPERTENSION/CAD Currently taking: see medication list Med Adherence: [x]  Yes    []  No Medication side effects: []  Yes    [x]  No Adherence with salt restriction: [x]  Yes    []  No Home Monitoring?: []  Yes    [x]  No Monitoring Frequency: []  Yes    []  No Home BP results range: []  Yes    []  No SOB? []  Yes    [x]  No Chest Pain? Little pain sometimes if he does too much but not with exercise. Uses SL Nitro last 2 days.  2-3 x a wk ago Leg swelling?: []  Yes    [x]  No Headaches?: []  Yes    [x]  No Dizziness? []  Yes    [x]  No Comments:   OSA: Last sleep study was about 10 years ago.  He has a CPAP machine but not using it consistently because he feels it blows too much air/pressure.  Tried changing the mask.  When I spoke with him last year he complained of polyuria and leakage of urine when he gets the urge to urinate.  He wakes up about twice at night to urinate.  He urinates about every hour during the day.  He feels he completely empties his bladder.  No post void dripping.  He does not have to strain to get his urine flow going.  My assessment was likely BPH versus urge incontinence.  I had prescribed Flomax for him but he did not take.  Patient Active Problem List   Diagnosis Date Noted  . Prediabetes 05/27/2019  . Essential hypertension 11/02/2018  .  Gastroesophageal reflux disease without esophagitis 11/02/2018  . Coronary artery disease of native artery of native heart with stable angina pectoris (HCC) 11/02/2018  . NSTEMI (non-ST elevated myocardial infarction) (HCC) 10/04/2018     Current Outpatient Medications on File Prior to Visit  Medication Sig Dispense Refill  . acetaminophen (TYLENOL) 500 MG tablet Take 500-1,000 mg by mouth every 6 (six) hours as needed (for pain or headaches).    Marland Kitchen. aspirin EC 81 MG tablet Take 1 tablet (81 mg total) by mouth daily. 90 tablet 3  . atorvastatin (LIPITOR) 40 MG tablet Take 1 tablet (40 mg total) by mouth daily. 90 tablet 3  . calcium carbonate (TUMS - DOSED IN MG ELEMENTAL CALCIUM) 500 MG chewable tablet Chew 1 tablet by mouth daily as needed for indigestion or heartburn.     . cholecalciferol (VITAMIN D3) 25 MCG (1000 UNIT) tablet Take 1,000 Units by mouth daily.    . isosorbide mononitrate (IMDUR) 30 MG 24 hr tablet Take 1 tablet (30 mg total) by mouth daily. 90 tablet 3  . losartan (COZAAR) 50 MG tablet Take 1 tablet (50 mg total) by mouth daily. 90 tablet 3  . metoprolol tartrate (LOPRESSOR) 25 MG tablet TAKE 1.5 TABLETS (  37.5 MG TOTAL) BY MOUTH 2 (TWO) TIMES DAILY. 90 tablet 5  . nitroGLYCERIN (NITROSTAT) 0.4 MG SL tablet Place 1 tablet (0.4 mg total) under the tongue every 5 (five) minutes x 3 doses as needed for chest pain. 25 tablet 6  . Omega-3 Fatty Acids (FISH OIL PO) Take 1 capsule by mouth daily.     No current facility-administered medications on file prior to visit.    No Known Allergies  Social History   Socioeconomic History  . Marital status: Single    Spouse name: Not on file  . Number of children: Not on file  . Years of education: Not on file  . Highest education level: Not on file  Occupational History  . Not on file  Tobacco Use  . Smoking status: Never Smoker  . Smokeless tobacco: Never Used  Vaping Use  . Vaping Use: Never used  Substance and Sexual  Activity  . Alcohol use: Yes    Comment: 2 beers/month  . Drug use: Never  . Sexual activity: Yes  Other Topics Concern  . Not on file  Social History Narrative   Lives locally with wife.  Does not routinely exercise.  Works in Therapist, sports.   Social Determinants of Health   Financial Resource Strain: Not on file  Food Insecurity: Not on file  Transportation Needs: Not on file  Physical Activity: Not on file  Stress: Not on file  Social Connections: Not on file  Intimate Partner Violence: Not on file    Family History  Problem Relation Age of Onset  . Cancer Mother        liver  . Heart attack Father        died @ 48    Past Surgical History:  Procedure Laterality Date  . CORONARY STENT INTERVENTION N/A 10/05/2018   Procedure: CORONARY STENT INTERVENTION;  Surgeon: Runell Gess, MD;  [facility];  Service: Cardiovascular;  Laterality: N/A;  MID LAD  . INTRAVASCULAR PRESSURE WIRE/FFR STUDY N/A 10/05/2018   Procedure: INTRAVASCULAR PRESSURE WIRE/FFR STUDY;  Surgeon: Runell Gess, MD;  Location: [facility];  Service: Cardiovascular;  Laterality: N/A;  FFR LAD  . LEFT HEART CATH AND CORONARY ANGIOGRAPHY N/A 10/05/2018   Procedure: LEFT HEART CATH AND CORONARY ANGIOGRAPHY;  Surgeon: Runell Gess, MD;  [facility]: [facility];  Service: Cardiovascular;  Laterality: N/A;    ROS: Review of Systems  Constitutional: Positive for unexpected weight change (gained 6 lbs since 03/2020). Negative for appetite change, fatigue and fever.  HENT: Negative for congestion, dental problem, hearing loss, sinus pressure and sore throat.   Eyes: Negative for visual disturbance.       He wears bifocals.  Last eye exam was last year.  Respiratory: Negative for cough and shortness of breath.   Cardiovascular: Negative for palpitations and leg swelling.  Gastrointestinal: Negative for abdominal distention, abdominal pain and blood in stool.  Endocrine:  Negative for polydipsia and polyphagia.  Genitourinary: Negative for dysuria.  Musculoskeletal: Negative for arthralgias.  Neurological: Negative for dizziness and headaches.  Psychiatric/Behavioral: Negative for agitation and dysphoric mood. The patient is not nervous/anxious.      PHYSICAL EXAM: BP 132/74   Pulse 74   Temp 98.2 F (36.8 C)   Resp 16   Ht 5\' 7"  (1.702 m)   Wt 171 lb (77.6 kg)   SpO2 97%   BMI 26.78 kg/m   Wt Readings from Last 3 Encounters:  08/07/20 171 lb (77.6 kg)  04/18/20 165 lb 3.2 oz (74.9 kg)  04/16/20 164 lb 3.2 oz (74.5 kg)    Physical Exam  General appearance - alert, well appearing, older Hispanic male and in no distress Mental status - normal mood, behavior, speech, dress, motor activity, and thought processes Eyes - pupils equal and reactive, extraocular eye movements intact Ears -moderate hard wax buildup in both ears obscuring view of the canal and tympanic membrane. Nose - normal and patent, no erythema, discharge or polyps Mouth - mucous membranes moist, pharynx normal without lesions Neck - supple, no significant adenopathy Lymphatics - no palpable lymphadenopathy, no hepatosplenomegaly Chest - clear to auscultation, no wheezes, rales or rhonchi, symmetric air entry Heart - normal rate, regular rhythm, normal S1, S2, no murmurs, rubs, clicks or gallops Abdomen - soft, nontender, nondistended, no masses or organomegaly Rectal -RN Vikki Ports present: No masses palpated in the rectal vault.  Prostate slightly enlarged. Musculoskeletal - no joint tenderness, deformity or swelling Extremities - peripheral pulses normal, no pedal edema, no clubbing or cyanosis  Depression screen [facility] 2/9 08/07/2020 05/18/2019  Decreased Interest 0 0  Down, Depressed, Hopeless 0 0  PHQ - 2 Score 0 0   GAD 7 : Generalized Anxiety Score 08/07/2020 05/18/2019  Nervous, Anxious, on Edge 0 0  Control/stop worrying 0 0  Worry too much - different things 0 0   Trouble relaxing 0 0  Restless 0 0  Easily annoyed or irritable 0 0  Afraid - awful might happen 0 0  Total GAD 7 Score 0 0     CMP Latest Ref Rng & Units 05/25/2019 10/16/2018 10/06/2018  Glucose 65 - 99 mg/dL 498(Y) - 641(R)  BUN 8 - 27 mg/dL 14 - 11  Creatinine 8.30 - 1.27 mg/dL 9.40 - 7.68  Sodium 088 - 144 mmol/L 137 - 138  Potassium 3.5 - 5.2 mmol/L 4.7 - 3.9  Chloride 96 - 106 mmol/L 99 - 105  CO2 20 - 29 mmol/L 26 - 24  Calcium 8.6 - 10.2 mg/dL 11.0 - 9.0  Total Protein 6.0 - 8.5 g/dL - 6.9 -  Total Bilirubin 0.0 - 1.2 mg/dL - 3.1(R) -  Alkaline Phos 39 - 117 IU/L - 108 -  AST 0 - 40 IU/L - 16 -  ALT 0 - 44 IU/L - 38 -   Lipid Panel     Component Value Date/Time   CHOL 181 10/05/2018 0252   TRIG 167 (H) 10/05/2018 0252   HDL 50 10/05/2018 0252   CHOLHDL 3.6 10/05/2018 0252   VLDL 33 10/05/2018 0252   LDLCALC 98 10/05/2018 0252    CBC    Component Value Date/Time   WBC 6.7 05/25/2019 0937   WBC 7.2 10/06/2018 0446   RBC 4.92 05/25/2019 0937   RBC 4.69 10/06/2018 0446   HGB 16.3 05/25/2019 0937   HGB 16.2 12/08/2009 1448   HCT 47.3 05/25/2019 0937   HCT 46.2 12/08/2009 1448   PLT 202 05/25/2019 0937   MCV 96 05/25/2019 0937   MCV 95.3 12/08/2009 1448   MCH 33.1 (H) 05/25/2019 0937   MCH 31.8 10/06/2018 0446   MCHC 34.5 05/25/2019 0937   MCHC 34.7 10/06/2018 0446   RDW 12.6 05/25/2019 0937   RDW 13.2 12/08/2009 1448   LYMPHSABS 2.3 04/26/2018 2333   LYMPHSABS 2.2 12/08/2009 1448   MONOABS 0.6 04/26/2018 2333   MONOABS 0.3 12/08/2009 1448   EOSABS 0.1 04/26/2018 2333   EOSABS 0.0 12/08/2009  1448   BASOSABS 0.0 04/26/2018 2333   BASOSABS 0.0 12/08/2009 1448    ASSESSMENT AND PLAN: 1. Annual physical exam  2. Essential hypertension Close to goal.  Continue metoprolol, isosorbide, Cozaar - CBC - Comprehensive metabolic panel - Lipid panel  3. Prediabetes Commended him on trying to eat healthy and on regular exercise.  Encouraged him to keep up  the good work.  Further dietary counseling given.  . - Hemoglobin A1c  4. Overweight (BMI 25.0-29.9) See #3 above  5. OSA on CPAP Recommend decreasing the pressure on his CPAP machine by about 2 units.  Patient is not sure of how to do that.  I told him he can take it to a medical supply store but he tells me that he purchased it years ago online.  Other option is to do another sleep study so that the pressure can be correctly adjusted.  He wants to hold off on all of these options for now.  6. Urge incontinence of urine 7. Benign prostatic hyperplasia with lower urinary tract symptoms, symptom details unspecified Sounds as though he has a combination of urge incontinence plus or minus BPH.  He is willing to try the Flomax.  I have told him that after 2 weeks of being on the medication if no improvement he should call me back and let me know so that we can do Vesicare instead.  Advised to go slow with position changes to avoid orthostatic drop in blood pressure - PSA - tamsulosin (FLOMAX) 0.4 MG CAPS capsule; Take 1 capsule (0.4 mg total) by mouth daily.  Dispense: 30 capsule; Refill: 3  8. Coronary artery disease of native artery of native heart with stable angina pectoris (HCC) Stable angina.  Followed by cardiology.  Continue current medications including beta-blocker, isosorbide, statin therapy and aspirin  9. Bilateral impacted cerumen I have told him that he can use wax softener to soften the wax in his ear and return next week as a nurse only visit to have his ears flushed. - carbamide peroxide (DEBROX) 6.5 % OTIC solution; Place 5 drops into both ears 2 (two) times daily. Use for 3 days  Dispense: 15 mL; Refill: 0  10. Screening for colon cancer Discussed colon cancer screening methods.  Patient wants to do Cologuard instead of colonoscopy. - Cologuard  11. Influenza vaccination declined Recommended.  He declined.  12. Need for hepatitis C screening test - Hepatitis C  Antibody  13. Screening for HIV (human immunodeficiency virus) - HIV antibody (with reflex)    Patient was given the opportunity to ask questions.  Patient verbalized understanding of the plan and was able to repeat key elements of the plan.   No orders of the defined types were placed in this encounter.    Requested Prescriptions    No prescriptions requested or ordered in this encounter    No follow-ups on file.  Jonah Blue, MD, FACP

## 2020-08-07 NOTE — Patient Instructions (Signed)
Apply the drops 5 drops to both ears twice a day for 3 days.  Follow-up next week for a nurse only visit to have your ears flushed.  I have prescribed the Flomax for your urinary symptoms.  Please let me know after 2 weeks whether it is helping.

## 2020-08-08 LAB — COMPREHENSIVE METABOLIC PANEL
ALT: 26 IU/L (ref 0–44)
AST: 28 IU/L (ref 0–40)
Albumin/Globulin Ratio: 1.8 (ref 1.2–2.2)
Albumin: 4.6 g/dL (ref 3.8–4.8)
Alkaline Phosphatase: 105 IU/L (ref 44–121)
BUN/Creatinine Ratio: 20 (ref 10–24)
BUN: 15 mg/dL (ref 8–27)
Bilirubin Total: 1.2 mg/dL (ref 0.0–1.2)
CO2: 27 mmol/L (ref 20–29)
Calcium: 9.3 mg/dL (ref 8.6–10.2)
Chloride: 103 mmol/L (ref 96–106)
Creatinine, Ser: 0.75 mg/dL — ABNORMAL LOW (ref 0.76–1.27)
GFR calc Af Amer: 114 mL/min/{1.73_m2} (ref >59)
GFR calc non Af Amer: 99 mL/min/{1.73_m2} (ref >59)
Globulin, Total: 2.5 g/dL (ref 1.5–4.5)
Glucose: 104 mg/dL — ABNORMAL HIGH (ref 65–99)
Potassium: 4.5 mmol/L (ref 3.5–5.2)
Sodium: 139 mmol/L (ref 134–144)
Total Protein: 7.1 g/dL (ref 6.0–8.5)

## 2020-08-08 LAB — LIPID PANEL
Chol/HDL Ratio: 2 ratio (ref 0.0–5.0)
Cholesterol, Total: 104 mg/dL (ref 100–199)
HDL: 51 mg/dL (ref >39)
LDL Chol Calc (NIH): 37 mg/dL (ref 0–99)
Triglycerides: 82 mg/dL (ref 0–149)
VLDL Cholesterol Cal: 16 mg/dL (ref 5–40)

## 2020-08-08 LAB — HIV ANTIBODY (ROUTINE TESTING W REFLEX): HIV Screen 4th Generation wRfx: NONREACTIVE

## 2020-08-08 LAB — CBC
Hematocrit: 44.4 % (ref 37.5–51.0)
Hemoglobin: 14.9 g/dL (ref 13.0–17.7)
MCH: 31.9 pg (ref 26.6–33.0)
MCHC: 33.6 g/dL (ref 31.5–35.7)
MCV: 95 fL (ref 79–97)
Platelets: 182 10*3/uL (ref 150–450)
RBC: 4.67 x10E6/uL (ref 4.14–5.80)
RDW: 12.6 % (ref 11.6–15.4)
WBC: 5.5 10*3/uL (ref 3.4–10.8)

## 2020-08-08 LAB — PSA: Prostate Specific Ag, Serum: 0.6 ng/mL (ref 0.0–4.0)

## 2020-08-08 LAB — HEMOGLOBIN A1C
Est. average glucose Bld gHb Est-mCnc: 126 mg/dL
Hgb A1c MFr Bld: 6 % — ABNORMAL HIGH (ref 4.8–5.6)

## 2020-08-08 LAB — HEPATITIS C ANTIBODY: Hep C Virus Ab: 0.2 s/co ratio (ref 0.0–0.9)

## 2020-08-08 MED FILL — TAMSULOSIN HCL 0.4 MG CAP: 0.4 | 30 days supply | Qty: 30 | Fill #0

## 2020-08-08 NOTE — Progress Notes (Signed)
Let patient know that his red blood cell, white blood cells and platelet counts are normal. Kidney and liver function tests are normal. Cholesterol levels are normal. PSA which is a screening test for prostate cancer is normal.  Screening for HIV and hepatitis C are negative. He still has prediabetes.  Continue healthy eating habits and regular exercise as discussed.

## 2020-08-12 ENCOUNTER — Ambulatory Visit: Payer: Managed Care, Other (non HMO) | Attending: Internal Medicine | Admitting: *Deleted

## 2020-08-12 ENCOUNTER — Other Ambulatory Visit: Payer: Self-pay

## 2020-08-12 DIAGNOSIS — H6123 Impacted cerumen, bilateral: Secondary | ICD-10-CM | POA: Diagnosis not present

## 2020-08-12 MED FILL — ATORVASTATIN CALCIUM 40 MG: 40 | 30 days supply | Qty: 30 | Fill #2

## 2020-08-12 MED FILL — ISOSORBIDE MN ER 30 MG TAB: 30 | 30 days supply | Qty: 30 | Fill #10

## 2020-08-12 MED FILL — NITROGLYCERIN 0.4 MG TAB SL: 0.4 | 25 days supply | Qty: 25 | Fill #3

## 2020-08-12 MED FILL — LOSARTAN POTASSIUM 50 MG TA: 50 | 30 days supply | Qty: 30 | Fill #5

## 2020-08-12 NOTE — Progress Notes (Signed)
Ceruminosis is noted bilaterally.  Ceruminosis is noted.  Wax is removed by syringing.  Removed cerumen from both ears and able to visualized ear drum. Ear drum assessed to be normal in appearance. Patient tolerated procedure well.

## 2020-08-26 LAB — EXTERNAL GENERIC LAB PROCEDURE: COLOGUARD: NEGATIVE

## 2020-08-26 LAB — COLOGUARD
COLOGUARD: NEGATIVE
Cologuard: NEGATIVE

## 2020-08-29 ENCOUNTER — Other Ambulatory Visit: Payer: Self-pay

## 2020-09-03 ENCOUNTER — Telehealth: Payer: Self-pay | Admitting: Internal Medicine

## 2020-09-03 NOTE — Telephone Encounter (Signed)
Contacted pt to go over results pt is aware and doesn't have any questions or concerns  

## 2020-09-10 MED FILL — TAMSULOSIN HCL 0.4 MG CAP: 0.4 | 30 days supply | Qty: 30 | Fill #1

## 2020-09-10 MED FILL — LOSARTAN POTASSIUM 50 MG TA: 50 | 30 days supply | Qty: 30 | Fill #6

## 2020-10-09 ENCOUNTER — Ambulatory Visit: Payer: Managed Care, Other (non HMO) | Admitting: Cardiovascular Disease

## 2020-10-09 MED FILL — ISOSORBIDE MN ER 30 MG TAB: 30 | 30 days supply | Qty: 30 | Fill #11

## 2020-10-09 MED FILL — TAMSULOSIN HCL 0.4 MG CAP: 0.4 | 30 days supply | Qty: 30 | Fill #2

## 2020-10-10 MED FILL — LOSARTAN POTASSIUM 50 MG TA: 50 | 30 days supply | Qty: 30 | Fill #7

## 2020-10-21 NOTE — Progress Notes (Signed)
Cardiology Office Note:    Date:  10/28/2020   ID:  Tramain Gershman, DOB [birth year], MRN [id number]  PCP:  Marcine Matar, MD  Cardiologist:  Parke Poisson, MD  Electrophysiologist:  None   Referring MD: Marcine Matar, MD   Chief Complaint/Reason for Referral: CAD  History of Present Illness:    Andrew Hunter is a 60 y.o. male with a history of NSTEMI s/p PCI to mid LAD and residual ostial first marginal 60-70% stenosis felt best for medical management at time of ACS. PCI on 10/05/2018.  Completed 1 year of DAPT.   Following with Dr. Tresa Endo for OSA, CPAP mask does not fit him properly and he feels there is too much air blowing in his face.  We will have sleep coordinator contact him, since he is not using his CPAP due to discomfort.   2 weeks ago had bad chest pain, had to take 2-3 nitro, pain and pressure on left chest.  Tells me the chest pain is left of substernal and points to the cardiac apex.  Pressure and tightness which is approximately 8 out of 10.  He tells me he has chest pain almost daily but usually far more mild than this.  He did have improvement when we started Imdur 30 mg daily, and he has an active prescription for nitroglycerin.  We discussed in detail today with shared decision making proceeding to cardiac catheterization versus medication titration for residual lesion.  He prefers medication titration at this time but agrees that if his chest pain continues he will go to the ER or call me to set up cardiac catheterization.  We discussed titration of Imdur today.    Past Medical History:  Diagnosis Date  . Coronary artery disease   . Essential hypertension     Past Surgical History:  Procedure Laterality Date  . CORONARY STENT INTERVENTION N/A 10/05/2018   Procedure: CORONARY STENT INTERVENTION;  Surgeon: Runell Gess, MD;  [facility];  Service: Cardiovascular;  Laterality: N/A;  MID LAD  . INTRAVASCULAR PRESSURE WIRE/FFR STUDY N/A  10/05/2018   Procedure: INTRAVASCULAR PRESSURE WIRE/FFR STUDY;  Surgeon: Runell Gess, MD;  [facility];  Service: Cardiovascular;  Laterality: N/A;  FFR LAD  . LEFT HEART CATH AND CORONARY ANGIOGRAPHY N/A 10/05/2018   Procedure: LEFT HEART CATH AND CORONARY ANGIOGRAPHY;  Surgeon: Runell Gess, MD;  [facility];  Service: Cardiovascular;  Laterality: N/A;    Current Medications: Current Meds  Medication Sig  . acetaminophen (TYLENOL) 500 MG tablet Take 500-1,000 mg by mouth every 6 (six) hours as needed (for pain or headaches).  Marland Kitchen aspirin EC 81 MG tablet Take 1 tablet (81 mg total) by mouth daily.  . calcium carbonate (TUMS - DOSED IN MG ELEMENTAL CALCIUM) 500 MG chewable tablet Chew 1 tablet by mouth daily as needed for indigestion or heartburn.   . carbamide peroxide (DEBROX) 6.5 % OTIC solution Place 5 drops into both ears 2 (two) times daily. Use for 3 days  . cholecalciferol (VITAMIN D3) 25 MCG (1000 UNIT) tablet Take 1,000 Units by mouth daily.  . Omega-3 Fatty Acids (FISH OIL PO) Take 1 capsule by mouth daily.  . tamsulosin (FLOMAX) 0.4 MG CAPS capsule Take 1 capsule (0.4 mg total) by mouth daily.  . [DISCONTINUED] atorvastatin (LIPITOR) 40 MG tablet Take 1 tablet (40 mg total) by mouth daily.  . [DISCONTINUED] isosorbide mononitrate (IMDUR) 30 MG 24 hr tablet  Take 1 tablet (30 mg total) by mouth daily.  . [DISCONTINUED] metoprolol tartrate (LOPRESSOR) 25 MG tablet TAKE 1.5 TABLETS (37.5 MG TOTAL) BY MOUTH 2 (TWO) TIMES DAILY.  . [DISCONTINUED] nitroGLYCERIN (NITROSTAT) 0.4 MG SL tablet Place 1 tablet (0.4 mg total) under the tongue every 5 (five) minutes x 3 doses as needed for chest pain.     Allergies:   Patient has no known allergies.   Social History   Tobacco Use  . Smoking status: Never Smoker  . Smokeless tobacco: Never Used  Vaping Use  . Vaping Use: Never used  Substance Use Topics  . Alcohol use: Yes    Comment: 2  beers/month  . Drug use: Never     Family History: The patient's family history includes Cancer in his mother; Heart attack in his father.  ROS:   Please see the history of present illness.    All other systems reviewed and are negative.  EKGs/Labs/Other Studies Reviewed:    The following studies were reviewed today:  EKG: Normal sinus rhythm, right bundle branch block QRS duration 120 ms.  Conduction system disease has progressed very slightly from incomplete right bundle branch block to right bundle branch block.  Recent Labs: 08/07/2020: ALT 26; BUN 15; Creatinine, Ser 0.75; Hemoglobin 14.9; Platelets 182; Potassium 4.5; Sodium 139  Recent Lipid Panel    Component Value Date/Time   CHOL 104 08/07/2020 1643   TRIG 82 08/07/2020 1643   HDL 51 08/07/2020 1643   CHOLHDL 2.0 08/07/2020 1643   CHOLHDL 3.6 10/05/2018 0252   VLDL 33 10/05/2018 0252   LDLCALC 37 08/07/2020 1643    Physical Exam:    VS:  BP 130/70 (BP Location: Left Arm, Patient Position: Sitting, Cuff Size: Normal)   Pulse 74   Ht 5\' 6"  (1.676 m)   Wt 176 lb (79.8 kg)   BMI 28.41 kg/m     Wt Readings from Last 5 Encounters:  10/28/20 176 lb (79.8 kg)  08/07/20 171 lb (77.6 kg)  04/18/20 165 lb 3.2 oz (74.9 kg)  04/16/20 164 lb 3.2 oz (74.5 kg)  01/15/20 170 lb 9.6 oz (77.4 kg)    Constitutional: No acute distress Eyes: sclera non-icteric, normal conjunctiva and lids ENMT: normal dentition, moist mucous membranes Cardiovascular: regular rhythm, normal rate, no murmurs. S1 and S2 normal. Radial pulses normal bilaterally. No jugular venous distention.  Respiratory: clear to auscultation bilaterally GI : normal bowel sounds, soft and nontender. No distention.   MSK: extremities warm, well perfused. No edema.  NEURO: grossly nonfocal exam, moves all extremities. PSYCH: alert and oriented x 3, normal mood and affect.   ASSESSMENT:    1. NSTEMI (non-ST elevated myocardial infarction) [facility]): February  2020, DES to LAD   2. Coronary artery disease of native artery of native heart with stable angina pectoris (HCC)   3. Chest pain, unspecified type   4. Essential hypertension   5. Hyperlipidemia with target LDL less than 70   6. OSA (obstructive sleep apnea)   7. Prediabetes   8. Medication management    PLAN:    NSTEMI (non-ST elevated myocardial infarction) [facility]): February 2020, DES to LAD Coronary artery disease of native artery of native heart with stable angina pectoris (HCC) - continue ASA 81 mg daily, atorvastatin 40 mg daily, metoprolol tartrate 37.5 mg BID, prn nitro -We will uptitrate his Imdur today to 60 mg daily for active angina.  I have encouraged him to set up a cardiac catheterization but  he would prefer medical management at this time.  We will set up close follow-up and if chest pain continues, I would recommend cardiac catheterization. - normal EF on echo at time of MI, no findings requiring follow up at this time.    Essential hypertension - losartan 50 mg daily, imdur, metoprolol   Hyperlipidemia with target LDL less than 70 - atorvastatin 40 mg daily, lipids are well controlled from December. LDL 37, trigs 82, hdl 51.   OSA (obstructive sleep apnea)  - undergoing workup with Dr. Tresa EndoKelly, we will have sleep coordinator contact him since he is not currently using his CPAP.     Total time of encounter: 30 minutes total time of encounter, including 25 minutes spent in face-to-face patient care on the date of this encounter. This time includes coordination of care and counseling regarding above mentioned problem list. Remainder of non-face-to-face time involved reviewing chart documents/testing relevant to the patient encounter and documentation in the medical record. I have independently reviewed documentation from referring provider.   Weston BrassGayatri Acharya, MD [place]  [facility]    Medication Adjustments/Labs and Tests Ordered: Current medicines are reviewed  at length with the patient today.  Concerns regarding medicines are outlined above.   Orders Placed This Encounter  Procedures  . EKG 12-Lead    Meds ordered this encounter  Medications  . atorvastatin (LIPITOR) 40 MG tablet    Sig: Take 1 tablet (40 mg total) by mouth daily.    Dispense:  90 tablet    Refill:  3  . isosorbide mononitrate (IMDUR) 60 MG 24 hr tablet    Sig: Take 1 tablet (60 mg total) by mouth daily.    Dispense:  30 tablet    Refill:  1  . losartan (COZAAR) 50 MG tablet    Sig: Take 1 tablet (50 mg total) by mouth daily.    Dispense:  90 tablet    Refill:  3  . metoprolol tartrate (LOPRESSOR) 25 MG tablet    Sig: Take 1.5 tablet  twice daily    Dispense:  90 tablet    Refill:  5  . nitroGLYCERIN (NITROSTAT) 0.4 MG SL tablet    Sig: Place 1 tablet (0.4 mg total) under the tongue every 5 (five) minutes x 3 doses as needed for chest pain.    Dispense:  25 tablet    Refill:  6    Patient Instructions  Medication Instructions:  INCREASE IMDUR TO 60mg  DAILY  *If you need a refill on your cardiac medications before your next appointment, please call your pharmacy*  Follow-Up: At [facility], you and your health needs are our priority.  As part of our continuing mission to provide you with exceptional heart care, we have created designated Provider Care Teams.  These Care Teams include your primary Cardiologist (physician) and Advanced Practice Providers (APPs -  Physician Assistants and Nurse Practitioners) who all work together to provide you with the care you need, when you need it.  We recommend signing up for the patient portal called "MyChart".  Sign up information is provided on this After Visit Summary.  MyChart is used to connect with patients for Virtual Visits (Telemedicine).  Patients are able to view lab/test results, encounter notes, upcoming appointments, etc.  Non-urgent messages can be sent to your provider as well.   To learn more about what you  can do with MyChart, go to [email or web address].    Your next appointment:   6 week(s)  The format for your next appointment:   In Person  Provider:   Weston Brass, MD  Other Instructions PLEASE CALL IF YOU ARE HAVING RECURRENT CHEST PAIN- SO THAT WE CAN DISCUSS HEART CATH. THE NUMBER TO THE OFFICE IS [phone]

## 2020-10-28 ENCOUNTER — Ambulatory Visit: Payer: Managed Care, Other (non HMO) | Admitting: Internal Medicine

## 2020-10-28 ENCOUNTER — Other Ambulatory Visit: Payer: Self-pay

## 2020-10-28 ENCOUNTER — Encounter: Payer: Self-pay | Admitting: Internal Medicine

## 2020-10-28 ENCOUNTER — Other Ambulatory Visit: Payer: Self-pay | Admitting: Internal Medicine

## 2020-10-28 VITALS — BP 130/70 | HR 74 | Ht 66.0 in | Wt 176.0 lb

## 2020-10-28 DIAGNOSIS — Z79899 Other long term (current) drug therapy: Secondary | ICD-10-CM

## 2020-10-28 DIAGNOSIS — R7303 Prediabetes: Secondary | ICD-10-CM

## 2020-10-28 DIAGNOSIS — I10 Essential (primary) hypertension: Secondary | ICD-10-CM

## 2020-10-28 DIAGNOSIS — I25118 Atherosclerotic heart disease of native coronary artery with other forms of angina pectoris: Secondary | ICD-10-CM

## 2020-10-28 DIAGNOSIS — I214 Non-ST elevation (NSTEMI) myocardial infarction: Secondary | ICD-10-CM

## 2020-10-28 DIAGNOSIS — E785 Hyperlipidemia, unspecified: Secondary | ICD-10-CM | POA: Diagnosis not present

## 2020-10-28 DIAGNOSIS — R079 Chest pain, unspecified: Secondary | ICD-10-CM

## 2020-10-28 DIAGNOSIS — G4733 Obstructive sleep apnea (adult) (pediatric): Secondary | ICD-10-CM

## 2020-10-28 MED ORDER — LOSARTAN POTASSIUM 50 MG PO TABS: 50.0000 mg | ORAL_TABLET | Freq: Every day | ORAL | 3 refills | Status: DC

## 2020-10-28 MED ORDER — NITROGLYCERIN 0.4 MG SL SUBL: 0.4000 mg | SUBLINGUAL_TABLET | SUBLINGUAL | 6 refills | Status: DC | PRN

## 2020-10-28 MED ORDER — ATORVASTATIN CALCIUM 40 MG PO TABS: 40.0000 mg | ORAL_TABLET | Freq: Every day | ORAL | 3 refills | Status: DC

## 2020-10-28 MED ORDER — METOPROLOL TARTRATE 25 MG PO TABS: ORAL_TABLET | ORAL | 5 refills | Status: DC

## 2020-10-28 MED ORDER — ISOSORBIDE MONONITRATE ER 60 MG PO TB24: 60.0000 mg | ORAL_TABLET | Freq: Every day | ORAL | 1 refills | Status: DC

## 2020-10-28 MED FILL — METOPROLOL TARTRATE 25 MG T: 25 | 30 days supply | Qty: 90 | Fill #0

## 2020-10-28 MED FILL — ISOSORBIDE MN ER 60 MG TAB: 60 | 30 days supply | Qty: 30 | Fill #0

## 2020-10-28 MED FILL — ATORVASTATIN CALCIUM 40 MG: 40 | 34 days supply | Qty: 34 | Fill #0

## 2020-10-28 MED FILL — NITROGLYCERIN 0.4 MG TAB SL: 0.4 | 10 days supply | Qty: 25 | Fill #0

## 2020-10-28 NOTE — Patient Instructions (Signed)
Medication Instructions:  INCREASE IMDUR TO 60mg  DAILY  *If you need a refill on your cardiac medications before your next appointment, please call your pharmacy*  Follow-Up: At [facility], you and your health needs are our priority.  As part of our continuing mission to provide you with exceptional heart care, we have created designated Provider Care Teams.  These Care Teams include your primary Cardiologist (physician) and Advanced Practice Providers (APPs -  Physician Assistants and Nurse Practitioners) who all work together to provide you with the care you need, when you need it.  We recommend signing up for the patient portal called "MyChart".  Sign up information is provided on this After Visit Summary.  MyChart is used to connect with patients for Virtual Visits (Telemedicine).  Patients are able to view lab/test results, encounter notes, upcoming appointments, etc.  Non-urgent messages can be sent to your provider as well.   To learn more about what you can do with MyChart, go to [facility].    Your next appointment:   6 week(s)  The format for your next appointment:   In Person  Provider:   [email or web address], MD  Other Instructions PLEASE CALL IF YOU ARE HAVING RECURRENT CHEST PAIN- SO THAT WE CAN DISCUSS HEART CATH. THE NUMBER TO THE OFFICE IS [phone]

## 2020-11-12 ENCOUNTER — Telehealth: Payer: Self-pay | Admitting: *Deleted

## 2020-11-12 NOTE — Telephone Encounter (Signed)
Left message to return a call to discuss CPAP masks.

## 2020-11-22 ENCOUNTER — Other Ambulatory Visit: Payer: Self-pay

## 2020-12-02 MED FILL — Losartan Potassium Tab 50 MG: ORAL | 30 days supply | Qty: 30 | Fill #0 | Status: AC

## 2020-12-02 MED FILL — Metoprolol Tartrate Tab 25 MG: ORAL | 30 days supply | Qty: 90 | Fill #0 | Status: AC

## 2020-12-02 MED FILL — Atorvastatin Calcium Tab 40 MG (Base Equivalent): ORAL | 34 days supply | Qty: 34 | Fill #0 | Status: AC

## 2020-12-02 MED FILL — Tamsulosin HCl Cap 0.4 MG: ORAL | 30 days supply | Qty: 30 | Fill #0 | Status: AC

## 2020-12-02 MED FILL — Isosorbide Mononitrate Tab ER 24HR 60 MG: ORAL | 30 days supply | Qty: 30 | Fill #0 | Status: AC

## 2020-12-03 ENCOUNTER — Other Ambulatory Visit: Payer: Self-pay

## 2020-12-04 ENCOUNTER — Other Ambulatory Visit: Payer: Self-pay

## 2020-12-12 ENCOUNTER — Ambulatory Visit: Payer: Managed Care, Other (non HMO) | Attending: Internal Medicine | Admitting: Internal Medicine

## 2020-12-12 ENCOUNTER — Other Ambulatory Visit: Payer: Self-pay

## 2020-12-19 ENCOUNTER — Other Ambulatory Visit: Payer: Self-pay

## 2020-12-19 ENCOUNTER — Encounter: Payer: Self-pay | Admitting: Internal Medicine

## 2020-12-19 ENCOUNTER — Ambulatory Visit: Payer: Managed Care, Other (non HMO) | Admitting: Internal Medicine

## 2020-12-19 VITALS — BP 132/78 | HR 70 | Ht 67.0 in | Wt 171.0 lb

## 2020-12-19 DIAGNOSIS — I25118 Atherosclerotic heart disease of native coronary artery with other forms of angina pectoris: Secondary | ICD-10-CM | POA: Diagnosis not present

## 2020-12-19 DIAGNOSIS — I214 Non-ST elevation (NSTEMI) myocardial infarction: Secondary | ICD-10-CM

## 2020-12-19 DIAGNOSIS — I10 Essential (primary) hypertension: Secondary | ICD-10-CM

## 2020-12-19 DIAGNOSIS — G4733 Obstructive sleep apnea (adult) (pediatric): Secondary | ICD-10-CM

## 2020-12-19 DIAGNOSIS — R079 Chest pain, unspecified: Secondary | ICD-10-CM

## 2020-12-19 DIAGNOSIS — E785 Hyperlipidemia, unspecified: Secondary | ICD-10-CM

## 2020-12-19 DIAGNOSIS — Z79899 Other long term (current) drug therapy: Secondary | ICD-10-CM

## 2020-12-19 NOTE — Progress Notes (Signed)
Cardiology Office Note:    Date:  12/19/2020   ID:  Breaker Springer, DOB May 06, 1961, MRN [id number]  PCP:  Marcine Matar, MD  Cardiologist:  Parke Poisson, MD  Electrophysiologist:  None   Referring MD: Marcine Matar, MD   Chief Complaint/Reason for Referral: CAD, prior NSTEMI  History of Present Illness:    Dickson Kostelnik is a 60 y.o. male with a history of NSTEMI s/p PCI to mid LAD and residual ostial first marginal 60-70% stenosis felt best for medical management at time of ACS. PCI on 10/05/2018.  Completed 1 year of DAPT.   Following with Dr. Tresa Endo for OSA, CPAP mask does not fit him properly and he feels there is too much air blowing in his face.  We will have sleep coordinator contact him, since he is not using his CPAP due to discomfort.  He missed a call from the sleep coordinator since our last visit and is curious to know what is the status of his mask.   We discussed titration of Imdur at last visit for chest pain.  He is feeling much better and feels that the Imdur dose is now correct for medical management of angina.  He occasionally has musculoskeletal chest pain if he sleeps in a strange position, 1 time he took nitro for this but it did not help confirming that this was likely musculoskeletal pain.  Denies shortness of breath, palpitations, PND, orthopnea, leg swelling.  Endorses urinary urgency and frequency.  Does take tamsulosin, but has been unable to connect with his primary care physician regarding this since he recently missed an appointment.  I have recommended that he follow-up with his PCP regarding tamsulosin and we discussed the mild antihypertensive effect of tamsulosin in addition to his other cardiac regimen.   Past Medical History:  Diagnosis Date  . Coronary artery disease   . Essential hypertension     Past Surgical History:  Procedure Laterality Date  . CORONARY STENT INTERVENTION N/A 10/05/2018   Procedure: CORONARY STENT INTERVENTION;   Surgeon: Runell Gess, MD;  [facility];  Service: Cardiovascular;  Laterality: N/A;  MID LAD  . INTRAVASCULAR PRESSURE WIRE/FFR STUDY N/A 10/05/2018   Procedure: INTRAVASCULAR PRESSURE WIRE/FFR STUDY;  Surgeon: Runell Gess, MD;  [facility]: [facility];  Service: Cardiovascular;  Laterality: N/A;  FFR LAD  . LEFT HEART CATH AND CORONARY ANGIOGRAPHY N/A 10/05/2018   Procedure: LEFT HEART CATH AND CORONARY ANGIOGRAPHY;  Surgeon: Runell Gess, MD;  [facility]: [facility];  Service: Cardiovascular;  Laterality: N/A;    Current Medications: Current Meds  Medication Sig  . acetaminophen (TYLENOL) 500 MG tablet Take 500-1,000 mg by mouth every 6 (six) hours as needed (for pain or headaches).  Marland Kitchen aspirin EC 81 MG tablet Take 1 tablet (81 mg total) by mouth daily.  Marland Kitchen atorvastatin (LIPITOR) 40 MG tablet TAKE 1 TABLET (40 MG TOTAL) BY MOUTH DAILY.  . calcium carbonate (TUMS - DOSED IN MG ELEMENTAL CALCIUM) 500 MG chewable tablet Chew 1 tablet by mouth daily as needed for indigestion or heartburn.   . cholecalciferol (VITAMIN D3) 25 MCG (1000 UNIT) tablet Take 1,000 Units by mouth daily.  . isosorbide mononitrate (IMDUR) 60 MG 24 hr tablet TAKE 1 TABLET (60 MG TOTAL) BY MOUTH DAILY.  Marland Kitchen losartan (COZAAR) 50 MG tablet TAKE 1 TABLET (50 MG TOTAL) BY MOUTH DAILY.  . metoprolol tartrate (LOPRESSOR) 25 MG tablet TAKE 1.5 TABLET TWICE DAILY  .  nitroGLYCERIN (NITROSTAT) 0.4 MG SL tablet PLACE 1 TABLET (0.4 MG TOTAL) UNDER THE TONGUE EVERY 5 (FIVE) MINUTES X 3 DOSES AS NEEDED FOR CHEST PAIN.  Marland Kitchen Omega-3 Fatty Acids (FISH OIL PO) Take 1 capsule by mouth daily.  . tamsulosin (FLOMAX) 0.4 MG CAPS capsule TAKE 1 CAPSULE (0.4 MG TOTAL) BY MOUTH DAILY.     Allergies:   Patient has no known allergies.   Social History   Tobacco Use  . Smoking status: Never Smoker  . Smokeless tobacco: Never Used  Vaping Use  . Vaping Use: Never used  Substance Use Topics  . Alcohol use:  Yes    Comment: 2 beers/month  . Drug use: Never     Family History: The patient's family history includes Cancer in his mother; Heart attack in his father.  ROS:   Please see the history of present illness.    All other systems reviewed and are negative.  EKGs/Labs/Other Studies Reviewed:    The following studies were reviewed today:   Recent Labs: 08/07/2020: ALT 26; BUN 15; Creatinine, Ser 0.75; Hemoglobin 14.9; Platelets 182; Potassium 4.5; Sodium 139  Recent Lipid Panel    Component Value Date/Time   CHOL 104 08/07/2020 1643   TRIG 82 08/07/2020 1643   HDL 51 08/07/2020 1643   CHOLHDL 2.0 08/07/2020 1643   CHOLHDL 3.6 10/05/2018 0252   VLDL 33 10/05/2018 0252   LDLCALC 37 08/07/2020 1643    Physical Exam:    VS:  BP 132/78   Pulse 70   Ht 5\' 7"  (1.702 m)   Wt 171 lb (77.6 kg)   SpO2 98%   BMI 26.78 kg/m     Wt Readings from Last 5 Encounters:  12/19/20 171 lb (77.6 kg)  10/28/20 176 lb (79.8 kg)  08/07/20 171 lb (77.6 kg)  04/18/20 165 lb 3.2 oz (74.9 kg)  04/16/20 164 lb 3.2 oz (74.5 kg)    Constitutional: No acute distress Eyes: sclera non-icteric, normal conjunctiva and lids ENMT: normal dentition, moist mucous membranes Cardiovascular: regular rhythm, normal rate, no murmurs. S1 and S2 normal. Radial pulses normal bilaterally. No jugular venous distention.  Respiratory: clear to auscultation bilaterally GI : normal bowel sounds, soft and nontender. No distention.   MSK: extremities warm, well perfused. No edema.  NEURO: grossly nonfocal exam, moves all extremities. PSYCH: alert and oriented x 3, normal mood and affect.   ASSESSMENT:    1. NSTEMI (non-ST elevated myocardial infarction) [facility]): February 2020, DES to LAD   2. Coronary artery disease of native artery of native heart with stable angina pectoris (HCC)   3. Chest pain, unspecified type   4. Essential hypertension   5. Hyperlipidemia with target LDL less than 70   6. OSA (obstructive  sleep apnea)   7. Medication management    PLAN:    NSTEMI (non-ST elevated myocardial infarction) [facility]): February 2020, DES to LAD Coronary artery disease of native artery of native heart with stable angina pectoris (HCC) Chest pain, unspecified type -Chest pain has improved on increased dose of Imdur.  Continue aspirin, atorvastatin, isosorbide, losartan, metoprolol at current doses.  Essential hypertension-stable on current regimen.  Continue current doses.  Hyperlipidemia with target LDL less than 70-lipids well controlled on atorvastatin 40 mg daily.  OSA (obstructive sleep apnea)- we will take him to the sleep coordinator today to follow-up given his difficulty with phone calls when he is at work.  Medication management-increased dose of Imdur is working well, continue 60 mg  daily.  Total time of encounter: 20 minutes total time of encounter, including 15 minutes spent in face-to-face patient care on the date of this encounter. This time includes coordination of care and counseling regarding above mentioned problem list. Remainder of non-face-to-face time involved reviewing chart documents/testing relevant to the patient encounter and documentation in the medical record. I have independently reviewed documentation from referring provider.   Weston Brass, MD, [facility] [place]  [facility]   Medication Adjustments/Labs and Tests Ordered: Current medicines are reviewed at length with the patient today.  Concerns regarding medicines are outlined above.   No orders of the defined types were placed in this encounter.   No orders of the defined types were placed in this encounter.   Patient Instructions  Medication Instructions:  Your physician recommends that you continue on your current medications as directed. Please refer to the Current Medication list given to you today.  *If you need a refill on your cardiac medications before your next appointment, please call your  pharmacy*   Lab Work: None ordered  Testing/Procedures: None ordered  Follow-Up: At [facility], you and your health needs are our priority.  As part of our continuing mission to provide you with exceptional heart care, we have created designated Provider Care Teams.  These Care Teams include your primary Cardiologist (physician) and Advanced Practice Providers (APPs -  Physician Assistants and Nurse Practitioners) who all work together to provide you with the care you need, when you need it.  We recommend signing up for the patient portal called "MyChart".  Sign up information is provided on this After Visit Summary.  MyChart is used to connect with patients for Virtual Visits (Telemedicine).  Patients are able to view lab/test results, encounter notes, upcoming appointments, etc.  Non-urgent messages can be sent to your provider as well.   To learn more about what you can do with MyChart, go to [email or web address].    Your next appointment:   6 month(s)  The format for your next appointment:   In Person  Provider:   Weston Brass, MD

## 2020-12-19 NOTE — Patient Instructions (Signed)
Medication Instructions:  Your physician recommends that you continue on your current medications as directed. Please refer to the Current Medication list given to you today.  *If you need a refill on your cardiac medications before your next appointment, please call your pharmacy*   Lab Work: None ordered  Testing/Procedures: None ordered  Follow-Up: At [facility], you and your health needs are our priority.  As part of our continuing mission to provide you with exceptional heart care, we have created designated Provider Care Teams.  These Care Teams include your primary Cardiologist (physician) and Advanced Practice Providers (APPs -  Physician Assistants and Nurse Practitioners) who all work together to provide you with the care you need, when you need it.  We recommend signing up for the patient portal called "MyChart".  Sign up information is provided on this After Visit Summary.  MyChart is used to connect with patients for Virtual Visits (Telemedicine).  Patients are able to view lab/test results, encounter notes, upcoming appointments, etc.  Non-urgent messages can be sent to your provider as well.   To learn more about what you can do with MyChart, go to [email or web address].    Your next appointment:   6 month(s)  The format for your next appointment:   In Person  Provider:   Weston Brass, MD

## 2020-12-26 ENCOUNTER — Emergency Department (HOSPITAL_COMMUNITY): Payer: Managed Care, Other (non HMO)

## 2020-12-26 ENCOUNTER — Other Ambulatory Visit: Payer: Self-pay

## 2020-12-26 ENCOUNTER — Emergency Department (HOSPITAL_COMMUNITY)
Admission: EM | Admit: 2020-12-26 | Discharge: 2020-12-26 | Disposition: A | Discharge status: Home / Self Care | Payer: Managed Care, Other (non HMO) | Attending: Emergency Medicine | Admitting: Emergency Medicine

## 2020-12-26 DIAGNOSIS — R059 Cough, unspecified: Secondary | ICD-10-CM | POA: Diagnosis not present

## 2020-12-26 DIAGNOSIS — I10 Essential (primary) hypertension: Secondary | ICD-10-CM | POA: Insufficient documentation

## 2020-12-26 DIAGNOSIS — Z7982 Long term (current) use of aspirin: Secondary | ICD-10-CM | POA: Diagnosis not present

## 2020-12-26 DIAGNOSIS — Z79899 Other long term (current) drug therapy: Secondary | ICD-10-CM | POA: Insufficient documentation

## 2020-12-26 DIAGNOSIS — R079 Chest pain, unspecified: Secondary | ICD-10-CM

## 2020-12-26 DIAGNOSIS — Z955 Presence of coronary angioplasty implant and graft: Secondary | ICD-10-CM | POA: Insufficient documentation

## 2020-12-26 DIAGNOSIS — R0789 Other chest pain: Secondary | ICD-10-CM | POA: Insufficient documentation

## 2020-12-26 DIAGNOSIS — R0602 Shortness of breath: Secondary | ICD-10-CM | POA: Diagnosis not present

## 2020-12-26 DIAGNOSIS — I2510 Atherosclerotic heart disease of native coronary artery without angina pectoris: Secondary | ICD-10-CM | POA: Diagnosis not present

## 2020-12-26 LAB — CBC
HCT: 45 % (ref 39.0–52.0)
Hemoglobin: 15.8 g/dL (ref 13.0–17.0)
MCH: 32.4 pg (ref 26.0–34.0)
MCHC: 35.1 g/dL (ref 30.0–36.0)
MCV: 92.4 fL (ref 80.0–100.0)
Platelets: 177 10*3/uL (ref 150–400)
RBC: 4.87 MIL/uL (ref 4.22–5.81)
RDW: 12.9 % (ref 11.5–15.5)
WBC: 9.3 10*3/uL (ref 4.0–10.5)
nRBC: 0 % (ref 0.0–0.2)

## 2020-12-26 LAB — BASIC METABOLIC PANEL
Anion gap: 5 (ref 5–15)
BUN: 17 mg/dL (ref 8–23)
CO2: 28 mmol/L (ref 22–32)
Calcium: 9.1 mg/dL (ref 8.9–10.3)
Chloride: 100 mmol/L (ref 98–111)
Creatinine, Ser: 1.09 mg/dL (ref 0.61–1.24)
GFR, Estimated: >60 mL/min (ref >60)
Glucose, Bld: 169 mg/dL — ABNORMAL HIGH (ref 70–99)
Potassium: 3.4 mmol/L — ABNORMAL LOW (ref 3.5–5.1)
Sodium: 133 mmol/L — ABNORMAL LOW (ref 135–145)

## 2020-12-26 LAB — TROPONIN I (HIGH SENSITIVITY)
Troponin I (High Sensitivity): 5 ng/L (ref <18)
Troponin I (High Sensitivity): 5 ng/L (ref <18)

## 2020-12-26 LAB — D-DIMER, QUANTITATIVE: D-Dimer, Quant: 0.5 ug/mL-FEU (ref 0.00–0.50)

## 2020-12-26 NOTE — ED Notes (Addendum)
Follow up appts reviewed w/ pt. Denies questions or concerns @ this time. Education on s/s of worsening and when to return. Evluated for CP denies @ this time, workup WNL. Left w/ even and steady gait. NAD noted. PIV removed and VSS. Accompanied by son.

## 2020-12-26 NOTE — ED Triage Notes (Signed)
Pt reports L sided chest pain, causing him difficulty sleeping x 2 nights, but worse today. Took one nitro 10 mins PTA with some relief in pain. Reports it hurts to take a deep breath.

## 2020-12-26 NOTE — ED Provider Notes (Addendum)
Emergency Medicine Provider Triage Evaluation Note  Andrew Hunter , a 60 y.o. male  was evaluated in triage.  Pt complains of chest pain.  Patient reports that he was not sleeping well over the past few nights, had some intermittent brief chest pains, but today about an hour and a half prior to arrival started having more constant chest pain on the left side of his chest.  Sometimes worse with breathing.  Took a nitro tablet about 10 minutes prior to coming to the ED and that seemed to help ease his pain but did not completely relieve it.  No lower extremity swelling, no syncope.  Has a history of heart disease and is followed by Dr. Jacques Navy with cardiology.  NSTEMI in 2020 with PCI to mid LAD  Review of Systems  Positive: Chest pain, shortness of breath Negative: Fever, syncope, vomiting, abdominal pain  Physical Exam  BP (!) 175/88   Pulse 84   Temp 98.5 F (36.9 C) (Oral)   Resp 14   SpO2 99%  Gen:   Awake, no distress  Resp:  Normal effort  Cardiac: RRR MSK:   Moves extremities without difficulty    Medical Decision Making  Medically screening exam initiated at 5:49 PM.  Appropriate orders placed.  Cinsere Mizrahi was informed that the remainder of the evaluation will be completed by another provider, this initial triage assessment does not replace that evaluation, and the importance of remaining in the ED until their evaluation is complete.        Dartha Lodge, PA-C 12/26/20 1757    Gerhard Munch, MD 12/26/20 [phone]

## 2020-12-26 NOTE — ED Provider Notes (Signed)
[facility] EMERGENCY DEPARTMENT Provider Note   CSN: [id number] Arrival date & time: 12/26/20  1744     History Chief Complaint  Patient presents with  . Chest Pain    Andrew Hunter is a 60 y.o. male.  The history is provided by the patient and medical records.  Chest Pain  Andrew Hunter is a 60 y.o. male who presents to the Emergency Department complaining of chest pain.  He presents to the ED complaining of left sided chest pain that started yesterday.  Pain significantly worsened this afternoon and he decided to present for evaluation.  He took nitroglycerin, which helped his pain.  Pain is described as a pressure.  He feels like he may have slept wrong.    No fever.  When the pain was intense it was worsened with deep breaths and he was sob.  Has a cough - ongoing problem due to pollen.  No abdominal pain, N/V/D.  No leg swelling/pain.         Past Medical History:  Diagnosis Date  . Coronary artery disease   . Essential hypertension     Patient Active Problem List   Diagnosis Date Noted  . Bilateral impacted cerumen 08/07/2020  . Benign prostatic hyperplasia with lower urinary tract symptoms 08/07/2020  . Urge incontinence of urine 08/07/2020  . OSA on CPAP 08/07/2020  . Overweight (BMI 25.0-29.9) 08/07/2020  . Prediabetes 05/27/2019  . Essential hypertension 11/02/2018  . Gastroesophageal reflux disease without esophagitis 11/02/2018  . Coronary artery disease of native artery of native heart with stable angina pectoris (HCC) 11/02/2018  . NSTEMI (non-ST elevated myocardial infarction) (HCC) 10/04/2018    Past Surgical History:  Procedure Laterality Date  . CORONARY STENT INTERVENTION N/A 10/05/2018   Procedure: CORONARY STENT INTERVENTION;  Surgeon: Runell Gess, MD;  Location: [facility];  Service: Cardiovascular;  Laterality: N/A;  MID LAD  . INTRAVASCULAR PRESSURE WIRE/FFR STUDY N/A 10/05/2018   Procedure: INTRAVASCULAR  PRESSURE WIRE/FFR STUDY;  Surgeon: Runell Gess, MD;  Location: [facility];  Service: Cardiovascular;  Laterality: N/A;  FFR LAD  . LEFT HEART CATH AND CORONARY ANGIOGRAPHY N/A 10/05/2018   Procedure: LEFT HEART CATH AND CORONARY ANGIOGRAPHY;  Surgeon: Runell Gess, MD;  Location: [facility];  Service: Cardiovascular;  Laterality: N/A;       Family History  Problem Relation Age of Onset  . Cancer Mother        liver  . Heart attack Father        died @ 32    Social History   Tobacco Use  . Smoking status: Never Smoker  . Smokeless tobacco: Never Used  Vaping Use  . Vaping Use: Never used  Substance Use Topics  . Alcohol use: Yes    Comment: 2 beers/month  . Drug use: Never    Home Medications Prior to Admission medications   Medication Sig Start Date End Date Taking? Authorizing Provider  acetaminophen (TYLENOL) 500 MG tablet Take 500-1,000 mg by mouth every 6 (six) hours as needed (for pain or headaches).    [provider]  aspirin EC 81 MG tablet Take 1 tablet (81 mg total) by mouth daily. 11/02/18   Marcine Matar, MD  atorvastatin (LIPITOR) 40 MG tablet TAKE 1 TABLET (40 MG TOTAL) BY MOUTH DAILY. 10/28/20 10/28/21  Parke Poisson, MD  calcium carbonate (TUMS - DOSED IN MG ELEMENTAL CALCIUM) 500 MG chewable tablet Chew 1  tablet by mouth daily as needed for indigestion or heartburn.     [provider]  cholecalciferol (VITAMIN D3) 25 MCG (1000 UNIT) tablet Take 1,000 Units by mouth daily.    [provider]  isosorbide mononitrate (IMDUR) 60 MG 24 hr tablet TAKE 1 TABLET (60 MG TOTAL) BY MOUTH DAILY. 10/28/20 10/28/21  Parke Poisson, MD  losartan (COZAAR) 50 MG tablet TAKE 1 TABLET (50 MG TOTAL) BY MOUTH DAILY. 10/28/20 10/28/21  Parke Poisson, MD  metoprolol tartrate (LOPRESSOR) 25 MG tablet TAKE 1.5 TABLET TWICE DAILY 10/28/20 10/28/21  Parke Poisson, MD  nitroGLYCERIN (NITROSTAT) 0.4 MG SL tablet PLACE 1 TABLET  (0.4 MG TOTAL) UNDER THE TONGUE EVERY 5 (FIVE) MINUTES X 3 DOSES AS NEEDED FOR CHEST PAIN. 10/28/20 10/28/21  Parke Poisson, MD  Omega-3 Fatty Acids (FISH OIL PO) Take 1 capsule by mouth daily.    [provider]  tamsulosin (FLOMAX) 0.4 MG CAPS capsule TAKE 1 CAPSULE (0.4 MG TOTAL) BY MOUTH DAILY. 08/07/20 08/07/21  Marcine Matar, MD    Allergies    Patient has no known allergies.  Review of Systems   Review of Systems  Cardiovascular: Positive for chest pain.  All other systems reviewed and are negative.   Physical Exam Updated Vital Signs BP 132/89   Pulse 85   Temp (!) 97.5 F (36.4 C) (Oral)   Resp 14   SpO2 98%   Physical Exam Vitals and nursing note reviewed.  Constitutional:      Appearance: He is well-developed.  HENT:     Head: Normocephalic and atraumatic.  Cardiovascular:     Rate and Rhythm: Normal rate and regular rhythm.     Heart sounds: No murmur heard.   Pulmonary:     Effort: Pulmonary effort is normal. No respiratory distress.     Breath sounds: Normal breath sounds.  Chest:     Chest wall: No tenderness.  Abdominal:     Palpations: Abdomen is soft.     Tenderness: There is no abdominal tenderness. There is no guarding or rebound.  Musculoskeletal:        General: No swelling or tenderness.  Skin:    General: Skin is warm and dry.  Neurological:     Mental Status: He is alert and oriented to person, place, and time.  Psychiatric:        Behavior: Behavior normal.     ED Results / Procedures / Treatments   Labs (all labs ordered are listed, but only abnormal results are displayed) Labs Reviewed  BASIC METABOLIC PANEL - Abnormal; Notable for the following components:      Result Value   Sodium 133 (*)    Potassium 3.4 (*)    Glucose, Bld 169 (*)    All other components within normal limits  CBC  D-DIMER, QUANTITATIVE  TROPONIN I (HIGH SENSITIVITY)  TROPONIN I (HIGH SENSITIVITY)    EKG EKG  Interpretation  Date/Time:  Friday Dec 26 2020 17:46:09 EDT Ventricular Rate:  89 PR Interval:  144 QRS Duration: 122 QT Interval:  390 QTC Calculation: 474 R Axis:   71 Text Interpretation: Normal sinus rhythm Right bundle branch block Inferior infarct , age undetermined Abnormal ECG Confirmed by Tilden Fossa [phone]) on 12/26/2020 6:14:13 PM   Radiology DG Chest 2 View  Result Date: 12/26/2020 CLINICAL DATA:  LEFT chest pain, difficulty sleeping for 2 nights EXAM: CHEST - 2 VIEW COMPARISON:  10/04/2018 FINDINGS: Normal heart size, mediastinal  contours, and pulmonary vascularity. Lungs clear. No acute infiltrate, pleural effusion, or pneumothorax. Osseous structures unremarkable. IMPRESSION: No acute abnormalities. Electronically Signed   By: Ulyses Southward M.D.   On: 12/26/2020 18:21    Procedures Procedures   Medications Ordered in ED Medications - No data to display  ED Course  I have reviewed the triage vital signs and the nursing notes.  Pertinent labs & imaging results that were available during my care of the patient were reviewed by me and considered in my medical decision making (see chart for details).    MDM Rules/Calculators/A&P                         patient here for evaluation of left sided chest pain that started yesterday, worsened today. EKG with right bundle branch block, similar when compared to priors, troponin is negative times two. He is pain free on assessment in the department. Presentation is not consistent with ACS, PE, dissection. Discussed with patient home care for chest pain, unclear source. Discussed outpatient follow-up and return precautions.  Final Clinical Impression(s) / ED Diagnoses Final diagnoses:  Nonspecific chest pain    Rx / DC Orders ED Discharge Orders    None       Tilden Fossa, MD 12/26/20 2356

## 2020-12-30 MED FILL — Metoprolol Tartrate Tab 25 MG: ORAL | 30 days supply | Qty: 90 | Fill #1 | Status: AC

## 2020-12-31 ENCOUNTER — Other Ambulatory Visit: Payer: Self-pay

## 2020-12-31 ENCOUNTER — Other Ambulatory Visit (HOSPITAL_COMMUNITY): Payer: Self-pay

## 2020-12-31 MED FILL — Losartan Potassium Tab 50 MG: ORAL | 30 days supply | Qty: 30 | Fill #0 | Status: AC

## 2021-01-01 ENCOUNTER — Other Ambulatory Visit: Payer: Self-pay

## 2021-01-28 ENCOUNTER — Other Ambulatory Visit: Payer: Self-pay | Admitting: Internal Medicine

## 2021-01-28 MED FILL — Metoprolol Tartrate Tab 25 MG: ORAL | 30 days supply | Qty: 90 | Fill #2 | Status: AC

## 2021-01-28 MED FILL — Atorvastatin Calcium Tab 40 MG (Base Equivalent): ORAL | 34 days supply | Qty: 34 | Fill #1 | Status: AC

## 2021-01-28 MED FILL — Losartan Potassium Tab 50 MG: ORAL | 30 days supply | Qty: 30 | Fill #1 | Status: AC

## 2021-01-29 ENCOUNTER — Other Ambulatory Visit: Payer: Self-pay

## 2021-01-30 ENCOUNTER — Other Ambulatory Visit: Payer: Self-pay

## 2021-02-10 ENCOUNTER — Other Ambulatory Visit: Payer: Self-pay | Admitting: Internal Medicine

## 2021-02-10 ENCOUNTER — Other Ambulatory Visit: Payer: Self-pay

## 2021-02-17 ENCOUNTER — Other Ambulatory Visit: Payer: Self-pay | Admitting: Internal Medicine

## 2021-02-17 ENCOUNTER — Other Ambulatory Visit: Payer: Self-pay

## 2021-02-18 ENCOUNTER — Telehealth: Payer: Self-pay | Admitting: Internal Medicine

## 2021-02-18 NOTE — Telephone Encounter (Signed)
*  STAT* If patient is at the pharmacy, call can be transferred to refill team.   1. Which medications need to be refilled? (please list name of each medication and dose if known) isosorbide mononitrate (IMDUR) 60 MG 24 hr tablet  2. Which pharmacy/location (including street and city if local pharmacy) is medication to be sent to? [facility]  3. Do they need a 30 day or 90 day supply? 30

## 2021-02-19 ENCOUNTER — Other Ambulatory Visit: Payer: Self-pay

## 2021-02-19 MED ORDER — ISOSORBIDE MONONITRATE ER 60 MG PO TB24
ORAL_TABLET | Freq: Every day | ORAL | 2 refills | Status: DC
  Filled 2021-02-19: qty 30, 30d supply, fill #0
  Filled 2021-04-07: qty 30, 30d supply, fill #1
  Filled 2021-05-03: qty 30, 30d supply, fill #2
  Filled 2021-06-03: qty 30, 30d supply, fill #3
  Filled 2021-07-09: qty 30, 30d supply, fill #4
  Filled 2021-08-05: qty 30, 30d supply, fill #5
  Filled 2022-01-04: qty 30, 30d supply, fill #0

## 2021-02-19 NOTE — Telephone Encounter (Signed)
Rx(s) sent to pharmacy electronically.  

## 2021-02-20 ENCOUNTER — Other Ambulatory Visit: Payer: Self-pay

## 2021-03-02 MED FILL — Metoprolol Tartrate Tab 25 MG: ORAL | 30 days supply | Qty: 90 | Fill #3 | Status: AC

## 2021-03-02 MED FILL — Atorvastatin Calcium Tab 40 MG (Base Equivalent): ORAL | 34 days supply | Qty: 34 | Fill #2 | Status: AC

## 2021-03-02 MED FILL — Losartan Potassium Tab 50 MG: ORAL | 30 days supply | Qty: 30 | Fill #2 | Status: AC

## 2021-03-03 ENCOUNTER — Other Ambulatory Visit: Payer: Self-pay

## 2021-03-05 ENCOUNTER — Other Ambulatory Visit: Payer: Self-pay

## 2021-03-30 MED FILL — Nitroglycerin SL Tab 0.4 MG: SUBLINGUAL | 30 days supply | Qty: 25 | Fill #0 | Status: AC

## 2021-03-30 MED FILL — Metoprolol Tartrate Tab 25 MG: ORAL | 30 days supply | Qty: 90 | Fill #4 | Status: AC

## 2021-03-30 MED FILL — Losartan Potassium Tab 50 MG: ORAL | 30 days supply | Qty: 30 | Fill #3 | Status: AC

## 2021-03-31 ENCOUNTER — Other Ambulatory Visit: Payer: Self-pay

## 2021-04-01 ENCOUNTER — Other Ambulatory Visit: Payer: Self-pay

## 2021-04-07 MED FILL — Atorvastatin Calcium Tab 40 MG (Base Equivalent): ORAL | 34 days supply | Qty: 34 | Fill #3 | Status: AC

## 2021-04-08 ENCOUNTER — Other Ambulatory Visit: Payer: Self-pay

## 2021-04-14 ENCOUNTER — Other Ambulatory Visit: Payer: Self-pay

## 2021-05-03 ENCOUNTER — Other Ambulatory Visit: Payer: Self-pay | Admitting: Internal Medicine

## 2021-05-03 MED FILL — Losartan Potassium Tab 50 MG: ORAL | 30 days supply | Qty: 30 | Fill #4 | Status: AC

## 2021-05-03 MED FILL — Atorvastatin Calcium Tab 40 MG (Base Equivalent): ORAL | 34 days supply | Qty: 34 | Fill #4 | Status: AC

## 2021-05-04 ENCOUNTER — Other Ambulatory Visit: Payer: Self-pay

## 2021-05-06 ENCOUNTER — Other Ambulatory Visit: Payer: Self-pay

## 2021-05-08 ENCOUNTER — Telehealth: Payer: Self-pay | Admitting: Internal Medicine

## 2021-05-08 ENCOUNTER — Other Ambulatory Visit: Payer: Self-pay

## 2021-05-08 NOTE — Telephone Encounter (Signed)
*  STAT* If patient is at the pharmacy, call can be transferred to refill team.   1. Which medications need to be refilled? (please list name of each medication and dose if known)   metoprolol tartrate (LOPRESSOR) 25 MG tablet    2. Which pharmacy/location (including street and city if local pharmacy) is medication to be sent to? [facility]  3. Do they need a 30 day or 90 day supply? 90 day

## 2021-05-20 ENCOUNTER — Other Ambulatory Visit: Payer: Self-pay | Admitting: Internal Medicine

## 2021-05-20 ENCOUNTER — Other Ambulatory Visit: Payer: Self-pay

## 2021-05-27 ENCOUNTER — Other Ambulatory Visit: Payer: Self-pay

## 2021-05-27 ENCOUNTER — Other Ambulatory Visit: Payer: Self-pay | Admitting: Internal Medicine

## 2021-06-03 MED FILL — Atorvastatin Calcium Tab 40 MG (Base Equivalent): ORAL | 34 days supply | Qty: 34 | Fill #5 | Status: AC

## 2021-06-03 MED FILL — Losartan Potassium Tab 50 MG: ORAL | 30 days supply | Qty: 30 | Fill #5 | Status: AC

## 2021-06-04 ENCOUNTER — Other Ambulatory Visit: Payer: Self-pay

## 2021-06-05 ENCOUNTER — Other Ambulatory Visit: Payer: Self-pay

## 2021-06-05 MED ORDER — METOPROLOL TARTRATE 25 MG PO TABS
ORAL_TABLET | ORAL | 5 refills | Status: DC
  Filled 2021-06-05: qty 90, 30d supply, fill #0
  Filled 2021-07-09: qty 90, 30d supply, fill #1
  Filled 2021-08-05: qty 90, 30d supply, fill #2
  Filled 2021-09-06: qty 90, 30d supply, fill #3
  Filled 2021-09-07: qty 90, 30d supply, fill #0

## 2021-06-05 NOTE — Telephone Encounter (Signed)
Pt calling requesting a refill on metoprolol. This medication was not sent to pt's pharmacy. This is Dr. Lupe Carney pt. Thanks

## 2021-06-10 ENCOUNTER — Other Ambulatory Visit: Payer: Self-pay

## 2021-07-09 MED FILL — Losartan Potassium Tab 50 MG: ORAL | 30 days supply | Qty: 30 | Fill #6 | Status: AC

## 2021-07-10 ENCOUNTER — Other Ambulatory Visit: Payer: Self-pay

## 2021-08-05 MED FILL — Atorvastatin Calcium Tab 40 MG (Base Equivalent): ORAL | 34 days supply | Qty: 34 | Fill #6 | Status: AC

## 2021-08-05 MED FILL — Losartan Potassium Tab 50 MG: ORAL | 30 days supply | Qty: 30 | Fill #7 | Status: AC

## 2021-08-06 ENCOUNTER — Other Ambulatory Visit: Payer: Self-pay

## 2021-08-07 ENCOUNTER — Other Ambulatory Visit: Payer: Self-pay

## 2021-09-06 MED FILL — Losartan Potassium Tab 50 MG: ORAL | 30 days supply | Qty: 30 | Fill #8 | Status: CN

## 2021-09-07 ENCOUNTER — Other Ambulatory Visit: Payer: Self-pay

## 2021-09-07 MED FILL — Losartan Potassium Tab 50 MG: ORAL | 30 days supply | Qty: 30 | Fill #0 | Status: AC

## 2021-09-10 ENCOUNTER — Other Ambulatory Visit: Payer: Self-pay

## 2021-10-02 ENCOUNTER — Other Ambulatory Visit: Payer: Self-pay

## 2021-10-02 ENCOUNTER — Ambulatory Visit (HOSPITAL_COMMUNITY)
Admission: RE | Admit: 2021-10-02 | Discharge: 2021-10-02 | Disposition: A | Discharge status: Home / Self Care | Payer: BC Managed Care – PPO | Source: Ambulatory Visit | Attending: Internal Medicine | Admitting: Internal Medicine

## 2021-10-02 ENCOUNTER — Encounter: Payer: Self-pay | Admitting: Internal Medicine

## 2021-10-02 ENCOUNTER — Ambulatory Visit: Payer: BC Managed Care – PPO | Attending: Internal Medicine | Admitting: Internal Medicine

## 2021-10-02 ENCOUNTER — Telehealth: Payer: Self-pay | Admitting: Internal Medicine

## 2021-10-02 VITALS — BP 148/83 | HR 83 | Resp 16 | Wt 170.6 lb

## 2021-10-02 DIAGNOSIS — N50812 Left testicular pain: Secondary | ICD-10-CM | POA: Insufficient documentation

## 2021-10-02 DIAGNOSIS — R1032 Left lower quadrant pain: Secondary | ICD-10-CM | POA: Diagnosis not present

## 2021-10-02 DIAGNOSIS — I25118 Atherosclerotic heart disease of native coronary artery with other forms of angina pectoris: Secondary | ICD-10-CM | POA: Diagnosis not present

## 2021-10-02 DIAGNOSIS — N401 Enlarged prostate with lower urinary tract symptoms: Secondary | ICD-10-CM

## 2021-10-02 DIAGNOSIS — I10 Essential (primary) hypertension: Secondary | ICD-10-CM

## 2021-10-02 DIAGNOSIS — Z125 Encounter for screening for malignant neoplasm of prostate: Secondary | ICD-10-CM

## 2021-10-02 DIAGNOSIS — N433 Hydrocele, unspecified: Secondary | ICD-10-CM | POA: Diagnosis not present

## 2021-10-02 MED ORDER — TAMSULOSIN HCL 0.4 MG PO CAPS
0.4000 mg | ORAL_CAPSULE | Freq: Every day | ORAL | 3 refills | Status: DC
  Filled 2021-10-02 – 2021-10-12 (×2): qty 30, 30d supply, fill #0
  Filled 2021-12-04: qty 30, 30d supply, fill #1
  Filled 2022-03-31: qty 30, 30d supply, fill #2
  Filled 2022-05-03: qty 30, 30d supply, fill #3

## 2021-10-02 MED ORDER — METOPROLOL TARTRATE 25 MG PO TABS
25.0000 mg | ORAL_TABLET | Freq: Two times a day (BID) | ORAL | 6 refills | Status: DC
  Filled 2021-10-02 – 2021-10-12 (×2): qty 60, 30d supply, fill #0

## 2021-10-02 NOTE — Telephone Encounter (Signed)
Phone call placed to patient tonight.  Patient informed that the Doppler ultrasound of his scrotum came back okay.  I await the results of the urinalysis.  If that comes back okay I will likely refer him to a urologist.  Patient expressed understanding and thanked me for calling.

## 2021-10-02 NOTE — Progress Notes (Signed)
Patient ID: Andrew Hunter, male    DOB: August 23, 1961  MRN: [id number]  CC: abdominal pain and LT testicular pain  Subjective: Andrew Hunter is a 61 y.o. male who presents for chronic ds management His concerns today include:  Patient with history of HTN, preDM, CAD with hx of NSTEMI, GERD, OSA CPAP but not using consistently, BPH, urge incont  C/o pain LLQ abdomin and LT testicular x 3 days. Pain in abdomen is constant but intensity changes No n/v/d, dysuria, fever.  No blood in urine. Passing stools nl.  No blood in stools. Hurts a little more when he is full No swelling in the testicle Had sex with wife last wk.  Does not have sex often and wonders if this is the cause.  Rates pain abdomen 1-3/10.  Rates testicular pain 1-5/10 at most;  Abdominal pain getting better.  Not taking anything for pain  HYPERTENSION/CAD Currently taking: see medication list.  Took meds already today.  Suppose to take Metoprolol 25 mg 1.5 BID but he has only been taiking 1 tab once a day.  Reports compliance with isosorbide, Cozaar, aspirin and atorvastatin. Med Adherence: [x]  Yes    []  No Medication side effects: []  Yes    [x]  No Adherence with salt restriction: [x]  Yes    []  No Home Monitoring?: []  Yes    []  No Monitoring Frequency:  Home BP results range:  SOB? []  Yes    [x]  No Chest Pain?: [x]  Yes - gets pain LT side of chest with exertion.  Last took SL Nitro last wk.  Last saw cardiologist  11/2020.  Missed last appt Leg swelling?: []  Yes    [x]  No Headaches?: []  Yes    [x]  No Dizziness? []  Yes    [x]  No Comments:   On last visit he complained of urinary frequency and was diagnosed with urge incontinence and BPH.  Prescribed Flomax.  Had good results.  Ran out of the Flomax and symptoms returned.  Requests refills. Patient Active Problem List   Diagnosis Date Noted   Bilateral impacted cerumen 08/07/2020   Benign prostatic hyperplasia with lower urinary tract symptoms 08/07/2020   Urge  incontinence of urine 08/07/2020   OSA on CPAP 08/07/2020   Overweight (BMI 25.0-29.9) 08/07/2020   Prediabetes 05/27/2019   Essential hypertension 11/02/2018   Gastroesophageal reflux disease without esophagitis 11/02/2018   Coronary artery disease of native artery of native heart with stable angina pectoris (HCC) 11/02/2018   NSTEMI (non-ST elevated myocardial infarction) (HCC) 10/04/2018     Current Outpatient Medications on File Prior to Visit  Medication Sig Dispense Refill   acetaminophen (TYLENOL) 500 MG tablet Take 500-1,000 mg by mouth every 6 (six) hours as needed (for pain or headaches).     aspirin EC 81 MG tablet Take 1 tablet (81 mg total) by mouth daily. 90 tablet 3   atorvastatin (LIPITOR) 40 MG tablet TAKE 1 TABLET (40 MG TOTAL) BY MOUTH DAILY. 90 tablet 3   calcium carbonate (TUMS - DOSED IN MG ELEMENTAL CALCIUM) 500 MG chewable tablet Chew 1 tablet by mouth daily as needed for indigestion or heartburn.      cholecalciferol (VITAMIN D3) 25 MCG (1000 UNIT) tablet Take 1,000 Units by mouth daily.     isosorbide mononitrate (IMDUR) 60 MG 24 hr tablet TAKE 1 TABLET (60 MG TOTAL) BY MOUTH DAILY. 90 tablet 2   losartan (COZAAR) 50 MG tablet TAKE 1 TABLET (50 MG TOTAL) BY MOUTH DAILY. 90 tablet  3   nitroGLYCERIN (NITROSTAT) 0.4 MG SL tablet PLACE 1 TABLET (0.4 MG TOTAL) UNDER THE TONGUE EVERY 5 (FIVE) MINUTES X 3 DOSES AS NEEDED FOR CHEST PAIN. 25 tablet 6   Omega-3 Fatty Acids (FISH OIL PO) Take 1 capsule by mouth daily.     No current facility-administered medications on file prior to visit.    No Known Allergies  Social History   Socioeconomic History   Marital status: Single    Spouse name: Not on file   Number of children: Not on file   Years of education: Not on file   Highest education level: Not on file  Occupational History   Not on file  Tobacco Use   Smoking status: Never   Smokeless tobacco: Never  Vaping Use   Vaping Use: Never used  Substance and  Sexual Activity   Alcohol use: Yes    Comment: 2 beers/month   Drug use: Never   Sexual activity: Yes  Other Topics Concern   Not on file  Social History Narrative   Lives locally with wife.  Does not routinely exercise.  Works in Therapist, sports.   Social Determinants of Health   Financial Resource Strain: Not on file  Food Insecurity: Not on file  Transportation Needs: Not on file  Physical Activity: Not on file  Stress: Not on file  Social Connections: Not on file  Intimate Partner Violence: Not on file    Family History  Problem Relation Age of Onset   Cancer Mother        liver   Heart attack Father        died @ 69    Past Surgical History:  Procedure Laterality Date   CORONARY STENT INTERVENTION N/A 10/05/2018   Procedure: CORONARY STENT INTERVENTION;  Surgeon: Runell Gess, MD;  Location: [facility];  Service: Cardiovascular;  Laterality: N/A;  MID LAD   INTRAVASCULAR PRESSURE WIRE/FFR STUDY N/A 10/05/2018   Procedure: INTRAVASCULAR PRESSURE WIRE/FFR STUDY;  Surgeon: Runell Gess, MD;  Location: [facility];  Service: Cardiovascular;  Laterality: N/A;  FFR LAD   LEFT HEART CATH AND CORONARY ANGIOGRAPHY N/A 10/05/2018   Procedure: LEFT HEART CATH AND CORONARY ANGIOGRAPHY;  Surgeon: Runell Gess, MD;  Location: [facility];  Service: Cardiovascular;  Laterality: N/A;    ROS: Review of Systems Negative except as stated above  PHYSICAL EXAM: BP (!) 148/83    Pulse 83    Resp 16    Wt 170 lb 9.6 oz (77.4 kg)    SpO2 99%    BMI 26.72 kg/m   Wt Readings from Last 3 Encounters:  10/02/21 170 lb 9.6 oz (77.4 kg)  12/19/20 171 lb (77.6 kg)  10/28/20 176 lb (79.8 kg)    Physical Exam   General appearance - alert, well appearing, and in no distress Mental status - normal mood, behavior, speech, dress, motor activity, and thought processes Chest - clear to auscultation, no wheezes, rales or rhonchi, symmetric air entry Heart - normal  rate, regular rhythm, normal S1, S2, no murmurs, rubs, clicks or gallops Abdomen -normal bowel sounds, nondistended, soft, nontender.  No guarding or rebound. GU Male -CMA Sundra Aland present as chaperone: Testicles are very small.  Slight tenderness on palpation of the left testicle.  No abnormal masses felt.  No inguinal hernia appreciated.  CMP Latest Ref Rng & Units 12/26/2020 08/07/2020 05/25/2019  Glucose 70 - 99 mg/dL 161(W) 960(A) 540(J)  BUN  8 - 23 mg/dL 17 15 14   Creatinine 0.61 - 1.24 mg/dL 3.54) 6.56(C  Sodium 135 - 145 mmol/L 133(L) 139 137  Potassium 3.5 - 5.1 mmol/L 3.4(L) 4.5 4.7  Chloride 98 - 111 mmol/L 100 103 99  CO2 22 - 32 mmol/L 28 27 26   Calcium 8.9 - 10.3 mg/dL 9.1 9.3 1.27  Total Protein 6.0 - 8.5 g/dL - 7.1 -  Total Bilirubin 0.0 - 1.2 mg/dL - 1.2 -  Alkaline Phos 44 - 121 IU/L - 105 -  AST 0 - 40 IU/L - 28 -  ALT 0 - 44 IU/L - 26 -   Lipid Panel     Component Value Date/Time   CHOL 104 08/07/2020 1643   TRIG 82 08/07/2020 1643   HDL 51 08/07/2020 1643   CHOLHDL 2.0 08/07/2020 1643   CHOLHDL 3.6 10/05/2018 0252   VLDL 33 10/05/2018 0252   LDLCALC 37 08/07/2020 1643    CBC    Component Value Date/Time   WBC 9.3 12/26/2020 1756   RBC 4.87 12/26/2020 1756   HGB 15.8 12/26/2020 1756   HGB 14.9 08/07/2020 1643   HGB 16.2 12/08/2009 1448   HCT 45.0 12/26/2020 1756   HCT 44.4 08/07/2020 1643   HCT 46.2 12/08/2009 1448   PLT 177 12/26/2020 1756   PLT 182 08/07/2020 1643   MCV 92.4 12/26/2020 1756   MCV 95 08/07/2020 1643   MCV 95.3 12/08/2009 1448   MCH 32.4 12/26/2020 1756   MCHC 35.1 12/26/2020 1756   RDW 12.9 12/26/2020 1756   RDW 12.6 08/07/2020 1643   RDW 13.2 12/08/2009 1448   LYMPHSABS 2.3 04/26/2018 2333   LYMPHSABS 2.2 12/08/2009 1448   MONOABS 0.6 04/26/2018 2333   MONOABS 0.3 12/08/2009 1448   EOSABS 0.1 04/26/2018 2333   EOSABS 0.0 12/08/2009 1448   BASOSABS 0.0 04/26/2018 2333   BASOSABS 0.0 12/08/2009 1448    ASSESSMENT  AND PLAN: 1. Left lower quadrant abdominal pain Differential diagnoses include kidney stone, mild diverticulitis or other pathology.  Exam at this time is benign.  We will check a urinalysis to see if any blood in the urine or whether c/w UTI.  Advised patient to monitor at this time and if any worsening over the weekend, he can be seen in the emergency room. - Urinalysis, Routine w reflex microscopic  2. Testicular pain, left We will get an ultrasound of the scrotum.   - Urinalysis, Routine w reflex microscopic - 06/26/2018 SCROTUM W/DOPPLER; Future  3. Essential hypertension Not at goal.  Recommend increasing the metoprolol to 25 mg twice a day. - CBC - Comprehensive metabolic panel  4. Coronary artery disease of native artery of native heart with stable angina pectoris (HCC) See #3 above.  We will get him back in with his cardiologist. - Lipid panel - Ambulatory referral to Cardiology  5. Benign prostatic hyperplasia with lower urinary tract symptoms, symptom details unspecified Refill sent on Flomax.  6. Prostate cancer screening Patient agreeable to prostate cancer screening with PSA check. - PSA   [employer] Language interpreter used during this encounter. 04/20/2011[place]  Patient was given the opportunity to ask questions.  Patient verbalized understanding of the plan and was able to repeat key elements of the plan.   Orders Placed This Encounter  Procedures   #[id number] SCROTUM W/DOPPLER   CBC   Comprehensive metabolic panel   Lipid panel   PSA   Urinalysis, Routine w reflex microscopic   Ambulatory  referral to Cardiology     Requested Prescriptions   Signed Prescriptions Disp Refills   metoprolol tartrate (LOPRESSOR) 25 MG tablet 60 tablet 6    Sig: Take 1 tablet (25 mg total) by mouth 2 (two) times daily.   tamsulosin (FLOMAX) 0.4 MG CAPS capsule 30 capsule 3    Sig: Take 1 capsule (0.4 mg total) by mouth daily.    No follow-ups on file.  Jonah Blue, MD, FACP

## 2021-10-02 NOTE — Telephone Encounter (Signed)
This encounter was created in error - please disregard.

## 2021-10-03 ENCOUNTER — Other Ambulatory Visit: Payer: Self-pay | Admitting: Internal Medicine

## 2021-10-03 DIAGNOSIS — R3129 Other microscopic hematuria: Secondary | ICD-10-CM

## 2021-10-03 DIAGNOSIS — N50812 Left testicular pain: Secondary | ICD-10-CM

## 2021-10-03 LAB — URINALYSIS, ROUTINE W REFLEX MICROSCOPIC
Bilirubin, UA: NEGATIVE
Glucose, UA: NEGATIVE
Ketones, UA: NEGATIVE
Leukocytes,UA: NEGATIVE
Nitrite, UA: NEGATIVE
Protein,UA: NEGATIVE
Specific Gravity, UA: 1.02 (ref 1.005–1.030)
Urobilinogen, Ur: 0.2 mg/dL (ref 0.2–1.0)
pH, UA: 6.5 (ref 5.0–7.5)

## 2021-10-03 LAB — LIPID PANEL
Chol/HDL Ratio: 2.2 ratio (ref 0.0–5.0)
Cholesterol, Total: 123 mg/dL (ref 100–199)
HDL: 56 mg/dL (ref >39)
LDL Chol Calc (NIH): 49 mg/dL (ref 0–99)
Triglycerides: 97 mg/dL (ref 0–149)
VLDL Cholesterol Cal: 18 mg/dL (ref 5–40)

## 2021-10-03 LAB — COMPREHENSIVE METABOLIC PANEL
ALT: 31 IU/L (ref 0–44)
AST: 23 IU/L (ref 0–40)
Albumin/Globulin Ratio: 2 (ref 1.2–2.2)
Albumin: 4.9 g/dL — ABNORMAL HIGH (ref 3.8–4.8)
Alkaline Phosphatase: 96 IU/L (ref 44–121)
BUN/Creatinine Ratio: 19 (ref 10–24)
BUN: 20 mg/dL (ref 8–27)
Bilirubin Total: 1.5 mg/dL — ABNORMAL HIGH (ref 0.0–1.2)
CO2: 27 mmol/L (ref 20–29)
Calcium: 9.7 mg/dL (ref 8.6–10.2)
Chloride: 101 mmol/L (ref 96–106)
Creatinine, Ser: 1.04 mg/dL (ref 0.76–1.27)
Globulin, Total: 2.5 g/dL (ref 1.5–4.5)
Glucose: 89 mg/dL (ref 70–99)
Potassium: 4.6 mmol/L (ref 3.5–5.2)
Sodium: 140 mmol/L (ref 134–144)
Total Protein: 7.4 g/dL (ref 6.0–8.5)
eGFR: 81 mL/min/{1.73_m2} (ref >59)

## 2021-10-03 LAB — CBC
Hematocrit: 48.8 % (ref 37.5–51.0)
Hemoglobin: 16.6 g/dL (ref 13.0–17.7)
MCH: 32.3 pg (ref 26.6–33.0)
MCHC: 34 g/dL (ref 31.5–35.7)
MCV: 95 fL (ref 79–97)
Platelets: 187 10*3/uL (ref 150–450)
RBC: 5.14 x10E6/uL (ref 4.14–5.80)
RDW: 12.8 % (ref 11.6–15.4)
WBC: 6.4 10*3/uL (ref 3.4–10.8)

## 2021-10-03 LAB — MICROSCOPIC EXAMINATION
Bacteria, UA: NONE SEEN
Casts: NONE SEEN /lpf

## 2021-10-03 LAB — PSA: Prostate Specific Ag, Serum: 0.7 ng/mL (ref 0.0–4.0)

## 2021-10-03 NOTE — Progress Notes (Signed)
Let patient know that urine did not show any signs of infection.  He has a small amount of blood in the urine.  I will refer him to the urologist for further evaluation of this and the testicular pain.  Kidney function test is good.  He had mild elevation in one of his liver enzymes which we will observe for now.  Cholesterol level normal.  Blood cell counts normal.  PSA which is a screening test for prostate cancer is normal.

## 2021-10-05 ENCOUNTER — Other Ambulatory Visit: Payer: Self-pay

## 2021-10-08 ENCOUNTER — Other Ambulatory Visit: Payer: Self-pay

## 2021-10-11 IMAGING — CR DG CHEST 2V
2 series · 2 of 2 positions shown · non-contrast
Comparison: 10/04/2018

CLINICAL DATA: LEFT chest pain, difficulty sleeping for 2 nights

EXAM:
CHEST - 2 VIEW

[chest pa]
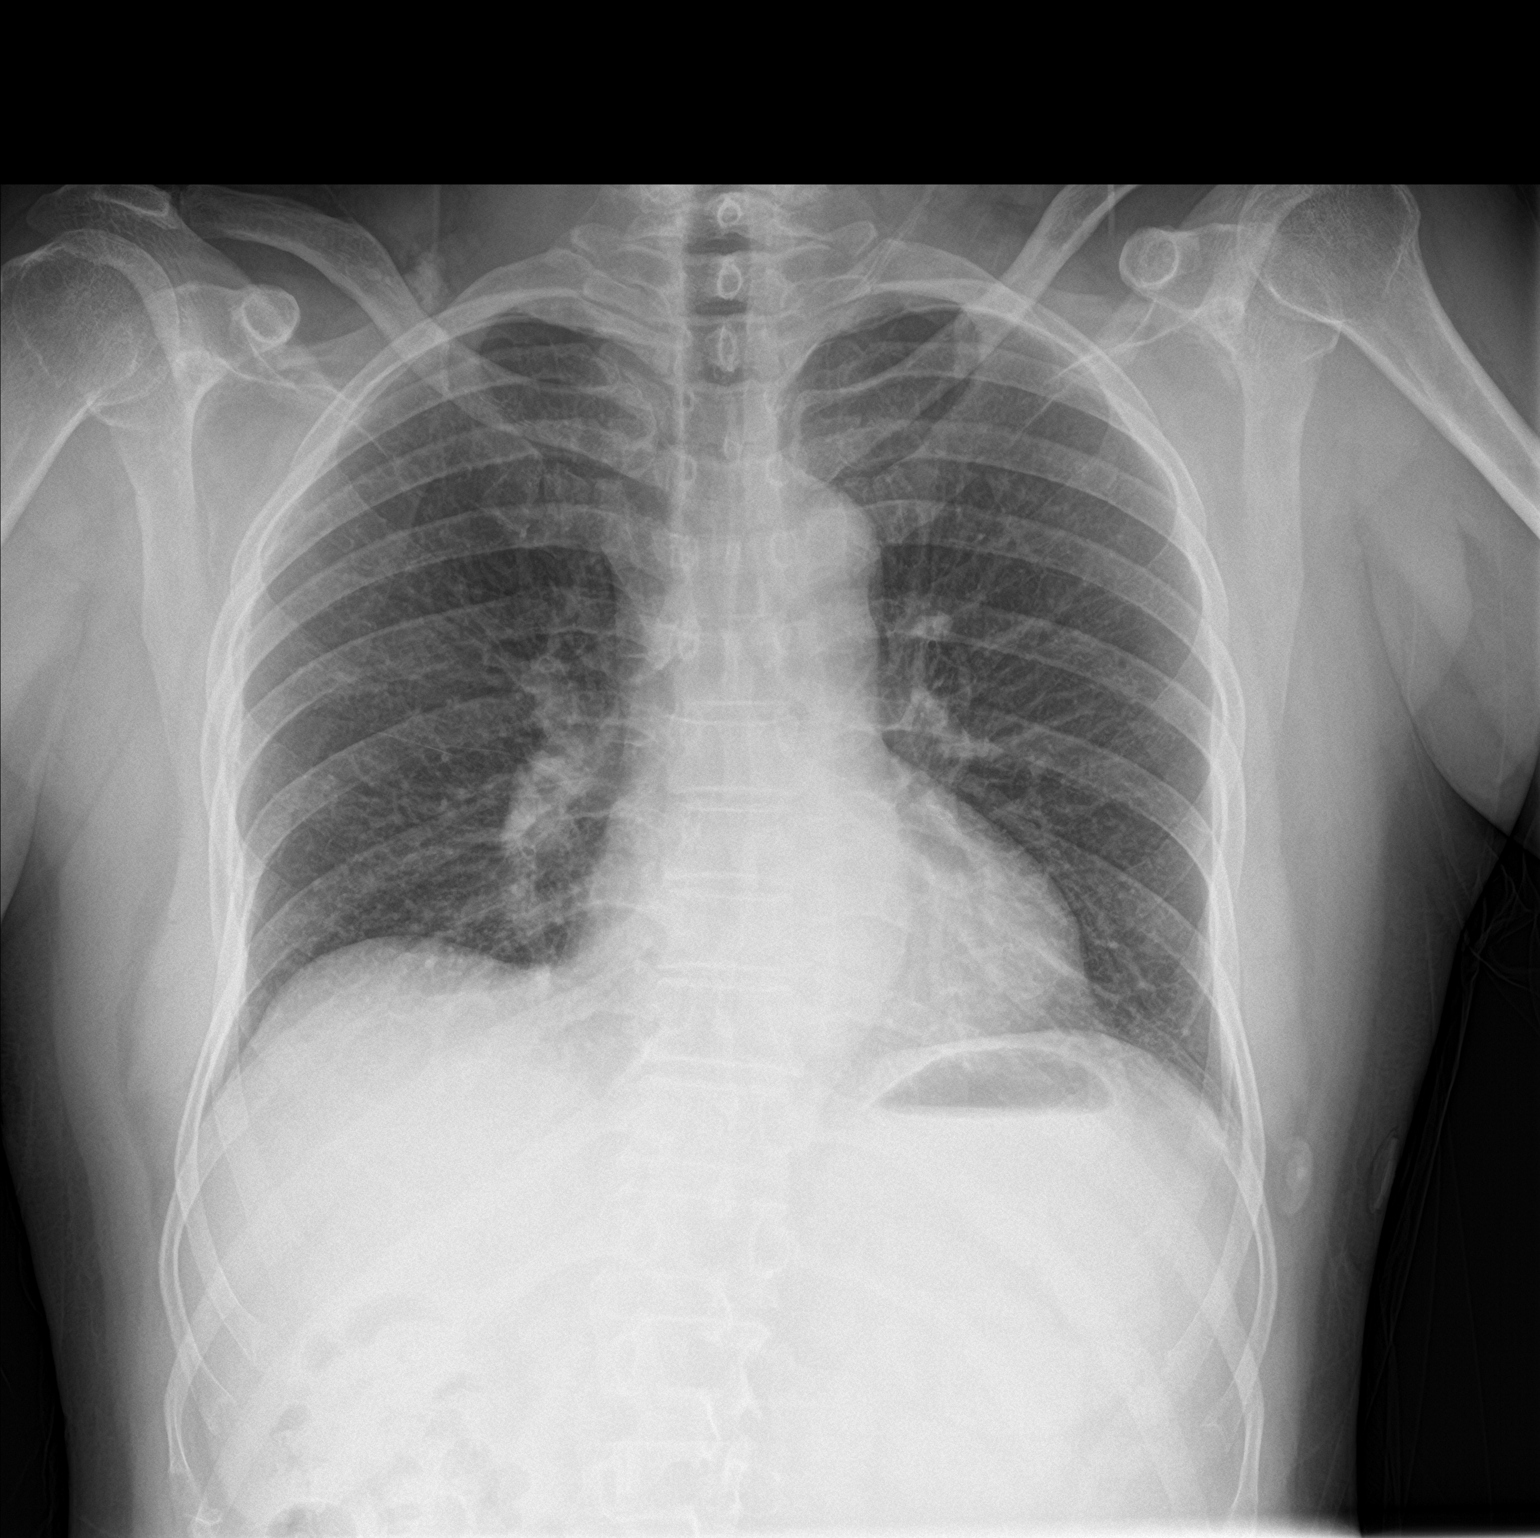

[chest lat]
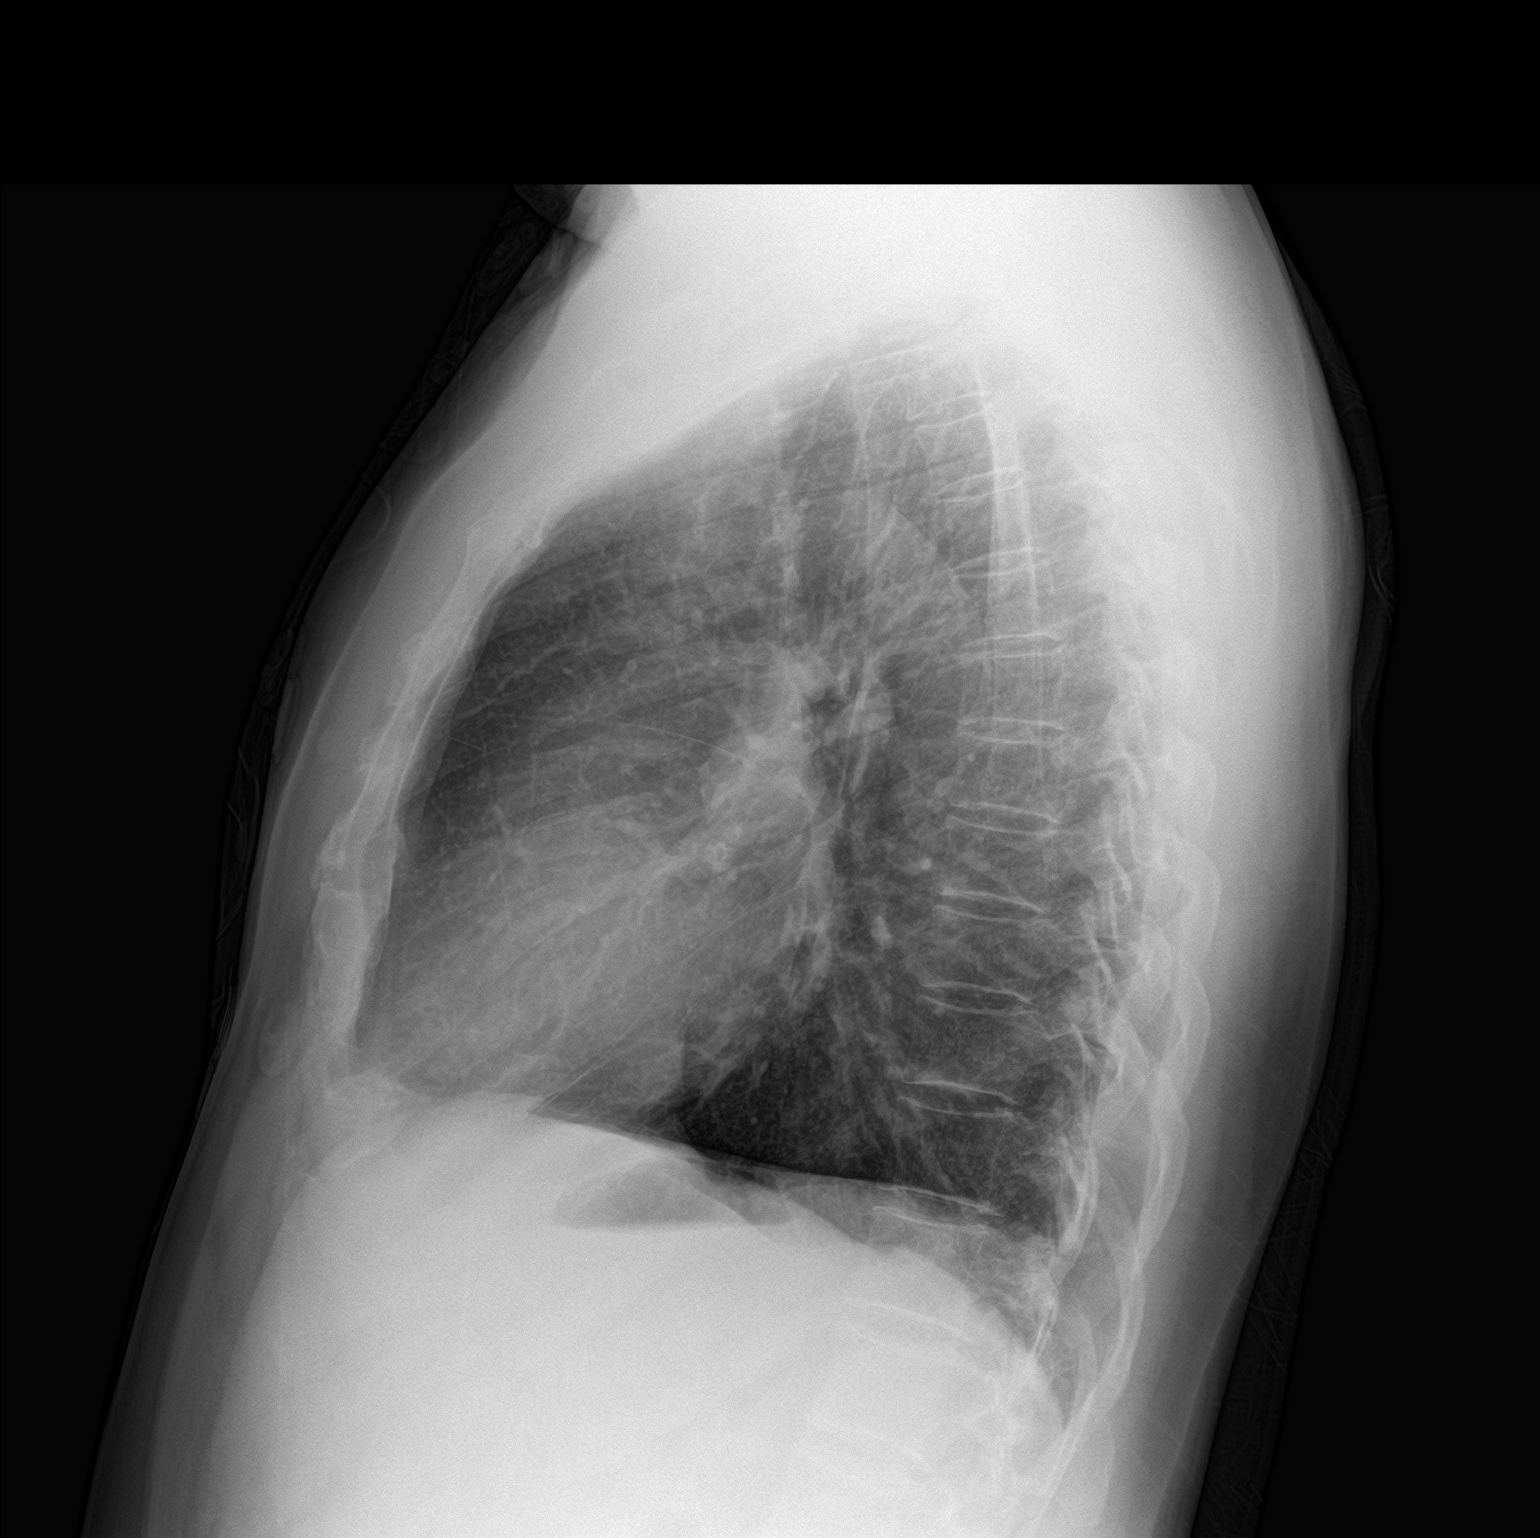

[2 of 2 positions shown; findings below may reference images not displayed]

FINDINGS: Normal heart size, mediastinal contours, and pulmonary vascularity.

Lungs clear.

No acute infiltrate, pleural effusion, or pneumothorax.

Osseous structures unremarkable.
IMPRESSION: No acute abnormalities.

## 2021-10-11 MED FILL — Losartan Potassium Tab 50 MG: ORAL | 30 days supply | Qty: 30 | Fill #1 | Status: AC

## 2021-10-12 ENCOUNTER — Other Ambulatory Visit: Payer: Self-pay

## 2021-10-20 ENCOUNTER — Ambulatory Visit: Payer: BC Managed Care – PPO | Attending: Internal Medicine | Admitting: Internal Medicine

## 2021-10-20 ENCOUNTER — Other Ambulatory Visit: Payer: Self-pay

## 2021-10-20 ENCOUNTER — Encounter: Payer: Self-pay | Admitting: Internal Medicine

## 2021-10-20 VITALS — BP 150/80 | HR 76 | Resp 16 | Ht 67.0 in | Wt 172.6 lb

## 2021-10-20 DIAGNOSIS — E663 Overweight: Secondary | ICD-10-CM

## 2021-10-20 DIAGNOSIS — I10 Essential (primary) hypertension: Secondary | ICD-10-CM

## 2021-10-20 DIAGNOSIS — Z6827 Body mass index (BMI) 27.0-27.9, adult: Secondary | ICD-10-CM

## 2021-10-20 DIAGNOSIS — I25118 Atherosclerotic heart disease of native coronary artery with other forms of angina pectoris: Secondary | ICD-10-CM | POA: Diagnosis not present

## 2021-10-20 DIAGNOSIS — Z0001 Encounter for general adult medical examination with abnormal findings: Secondary | ICD-10-CM

## 2021-10-20 DIAGNOSIS — Z23 Encounter for immunization: Secondary | ICD-10-CM | POA: Diagnosis not present

## 2021-10-20 DIAGNOSIS — H6123 Impacted cerumen, bilateral: Secondary | ICD-10-CM | POA: Diagnosis not present

## 2021-10-20 DIAGNOSIS — Z0000 Encounter for general adult medical examination without abnormal findings: Secondary | ICD-10-CM

## 2021-10-20 MED ORDER — LOSARTAN POTASSIUM 50 MG PO TABS
ORAL_TABLET | Freq: Every day | ORAL | 3 refills | Status: DC
  Filled 2021-10-20: qty 90, fill #0
  Filled 2021-11-04: qty 90, 90d supply, fill #0
  Filled 2022-02-03: qty 90, 90d supply, fill #1
  Filled 2022-05-03: qty 90, 90d supply, fill #2
  Filled 2022-07-29: qty 90, 90d supply, fill #3

## 2021-10-20 MED ORDER — METOPROLOL TARTRATE 25 MG PO TABS
37.5000 mg | ORAL_TABLET | Freq: Two times a day (BID) | ORAL | 6 refills | Status: DC
  Filled 2021-10-20 – 2021-11-04 (×2): qty 90, 30d supply, fill #0
  Filled 2022-03-31: qty 90, 30d supply, fill #1
  Filled 2022-05-03: qty 90, 30d supply, fill #2
  Filled 2022-06-17 – 2022-07-29 (×3): qty 90, 30d supply, fill #3
  Filled 2022-09-02: qty 90, 30d supply, fill #4
  Filled 2022-10-05: qty 90, 30d supply, fill #5

## 2021-10-20 MED ORDER — ATORVASTATIN CALCIUM 40 MG PO TABS
ORAL_TABLET | Freq: Every day | ORAL | 3 refills | Status: DC
  Filled 2021-10-20 – 2022-01-02 (×2): qty 90, 90d supply, fill #0
  Filled 2022-03-31: qty 90, 90d supply, fill #1
  Filled 2022-05-03 – 2022-06-17 (×2): qty 90, 90d supply, fill #2

## 2021-10-20 NOTE — Addendum Note (Signed)
Addended by: Particia Lather on: 10/20/2021 01:43 PM   Modules accepted: Orders

## 2021-10-20 NOTE — Progress Notes (Signed)
Patient ID: Andrew Hunter, male    DOB: [birth year]  MRN: [id number]  CC: Annual Exam   Subjective: Andrew Hunter is a 61 y.o. male who presents for physical His concerns today include:  Patient with history of HTN, preDM, CAD with hx of NSTEMI, GERD, OSA CPAP but not using consistently, BPH, urge incont  Pt referred to urologist after last visit for testicular pain and microscopic hematuria.  Testicular ultrasound showed bilateral small hydroceles.  Pain in testicular has ceased He awaits appointment with urology.  He has not been called as yet.  HYPERTENSION Currently taking: see medication list -Metoprolol, Imdur and Losartan and took meds already today Med Adherence: [x]  Yes    []  No Medication side effects: []  Yes    [x]  No Adherence with salt restriction: [x]  Yes    []  No Home Monitoring?: []  Yes    [x]  No but does have a device Monitoring Frequency:  Home BP results range:  SOB? []  Yes    [x]  No Chest Pain?: [x]  Yes -sometimes that goes away with rest.  Last used SL Nitro 2-3 wks ago.  Called by cardiology x 2 since last visit.  He plans to call back today    Leg swelling?: []  Yes    [x]  No Headaches?: []  Yes    [x]  No Dizziness? []  Yes    [x]  No Comments: dose a lot of walking at work.  Works in a factory.    HM:  had 2 COVID-19 vaccines.  He agrees to get booster.  Due for shingles vaccine. I went over recent lab results. Patient Active Problem List   Diagnosis Date Noted   Bilateral impacted cerumen 08/07/2020   Benign prostatic hyperplasia with lower urinary tract symptoms 08/07/2020   Urge incontinence of urine 08/07/2020   OSA on CPAP 08/07/2020   Overweight (BMI 25.0-29.9) 08/07/2020   Prediabetes 05/27/2019   Essential hypertension 11/02/2018   Gastroesophageal reflux disease without esophagitis 11/02/2018   Coronary artery disease of native artery of native heart with stable angina pectoris ([place]) 11/02/2018   NSTEMI (non-ST elevated myocardial infarction)  ([place]) 10/04/2018     Current Outpatient Medications on File Prior to Visit  Medication Sig Dispense Refill   acetaminophen (TYLENOL) 500 MG tablet Take 500-1,000 mg by mouth every 6 (six) hours as needed (for pain or headaches).     aspirin EC 81 MG tablet Take 1 tablet (81 mg total) by mouth daily. 90 tablet 3   calcium carbonate (TUMS - DOSED IN MG ELEMENTAL CALCIUM) 500 MG chewable tablet Chew 1 tablet by mouth daily as needed for indigestion or heartburn.      cholecalciferol (VITAMIN D3) 25 MCG (1000 UNIT) tablet Take 1,000 Units by mouth daily.     isosorbide mononitrate (IMDUR) 60 MG 24 hr tablet TAKE 1 TABLET (60 MG TOTAL) BY MOUTH DAILY. 90 tablet 2   nitroGLYCERIN (NITROSTAT) 0.4 MG SL tablet PLACE 1 TABLET (0.4 MG TOTAL) UNDER THE TONGUE EVERY 5 (FIVE) MINUTES X 3 DOSES AS NEEDED FOR CHEST PAIN. 25 tablet 6   Omega-3 Fatty Acids (FISH OIL PO) Take 1 capsule by mouth daily.     tamsulosin (FLOMAX) 0.4 MG CAPS capsule Take 1 capsule (0.4 mg total) by mouth daily. 30 capsule 3   No current facility-administered medications on file prior to visit.    No Known Allergies  Social History   Socioeconomic History   Marital status: Single    Spouse name: Not on file  Number of children: Not on file   Years of education: Not on file   Highest education level: Not on file  Occupational History   Not on file  Tobacco Use   Smoking status: Never   Smokeless tobacco: Never  Vaping Use   Vaping Use: Never used  Substance and Sexual Activity   Alcohol use: Yes    Comment: 2 beers/month   Drug use: Never   Sexual activity: Yes  Other Topics Concern   Not on file  Social History Narrative   Lives locally with wife.  Does not routinely exercise.  Works in Therapist, sports.   Social Determinants of Health   Financial Resource Strain: Not on file  Food Insecurity: Not on file  Transportation Needs: Not on file  Physical Activity: Not on file  Stress: Not on file  Social  Connections: Not on file  Intimate Partner Violence: Not on file    Family History  Problem Relation Age of Onset   Cancer Mother        liver   Heart attack Father        died @ 39    Past Surgical History:  Procedure Laterality Date   CORONARY STENT INTERVENTION N/A 10/05/2018   Procedure: CORONARY STENT INTERVENTION;  Surgeon: Runell Gess, MD;  Location: [facility];  Service: Cardiovascular;  Laterality: N/A;  MID LAD   INTRAVASCULAR PRESSURE WIRE/FFR STUDY N/A 10/05/2018   Procedure: INTRAVASCULAR PRESSURE WIRE/FFR STUDY;  Surgeon: Runell Gess, MD;  Location: [facility];  Service: Cardiovascular;  Laterality: N/A;  FFR LAD   LEFT HEART CATH AND CORONARY ANGIOGRAPHY N/A 10/05/2018   Procedure: LEFT HEART CATH AND CORONARY ANGIOGRAPHY;  Surgeon: Runell Gess, MD;  Location: [facility];  Service: Cardiovascular;  Laterality: N/A;    ROS: Review of Systems  Constitutional:  Negative for activity change and appetite change.  HENT:  Negative for hearing loss and sore throat.   Eyes:  Negative for visual disturbance.  Respiratory:  Negative for shortness of breath.   Gastrointestinal:  Negative for abdominal pain and blood in stool.  Genitourinary:  Negative for difficulty urinating and hematuria.  Musculoskeletal:  Negative for arthralgias.  Skin:        Has a rash on the right side of his nostril.  Patient states it appeared last week after he was constantly rubbing his nose because he was having a little bit of runny nose.  Denies any itching or discomfort from it.  Neurological:  Negative for headaches.  Psychiatric/Behavioral:  Negative for dysphoric mood. The patient is not nervous/anxious.   Negative except as stated above  PHYSICAL EXAM: BP (!) 150/80    Pulse 76    Resp 16    Ht 5\' 7"  (1.702 m)    Wt 172 lb 9.6 oz (78.3 kg)    SpO2 99%    BMI 27.03 kg/m   Physical Exam   General appearance - alert, well appearing, and in no  distress Mental status - normal mood, behavior, speech, dress, motor activity, and thought processes Eyes - pupils equal and reactive, extraocular eye movements intact Ears -small amount of soft wax buildup in both ear canals obscuring view of the tympanic membrane. Nose - normal and patent, no erythema, discharge or polyps Mouth - mucous membranes moist, pharynx normal without lesions Neck - supple, no significant adenopathy Lymphatics - no palpable lymphadenopathy, no hepatosplenomegaly Chest - clear to auscultation, no wheezes,  rales or rhonchi, symmetric air entry Heart - normal rate, regular rhythm, normal S1, S2, no murmurs, rubs, clicks or gallops Abdomen - soft, nontender, nondistended, no masses or organomegaly Musculoskeletal -mild joint enlargement of the proximal joints of the hands. Extremities - peripheral pulses normal, no pedal edema, no clubbing or cyanosis Skin -dried slightly scabbed over punctated spots on the right nares  CMP Latest Ref Rng & Units 10/02/2021 12/26/2020 08/07/2020  Glucose 70 - 99 mg/dL 89 169(H) 104(H)  BUN 8 - 27 mg/dL 20 17 15   Creatinine 0.76 - 1.27 mg/dL 1.04 1.09 0.75(L)  Sodium 134 - 144 mmol/L 140 133(L) 139  Potassium 3.5 - 5.2 mmol/L 4.6 3.4(L) 4.5  Chloride 96 - 106 mmol/L 101 100 103  CO2 20 - 29 mmol/L 27 28 27   Calcium 8.6 - 10.2 mg/dL 9.7 9.1 9.3  Total Protein 6.0 - 8.5 g/dL 7.4 - 7.1  Total Bilirubin 0.0 - 1.2 mg/dL 1.5(H) - 1.2  Alkaline Phos 44 - 121 IU/L 96 - 105  AST 0 - 40 IU/L 23 - 28  ALT 0 - 44 IU/L 31 - 26   Lipid Panel     Component Value Date/Time   CHOL 123 10/02/2021 1519   TRIG 97 10/02/2021 1519   HDL 56 10/02/2021 1519   CHOLHDL 2.2 10/02/2021 1519   CHOLHDL 3.6 10/05/2018 0252   VLDL 33 10/05/2018 0252   LDLCALC 49 10/02/2021 1519    CBC    Component Value Date/Time   WBC 6.4 10/02/2021 1519   WBC 9.3 12/26/2020 1756   RBC 5.14 10/02/2021 1519   RBC 4.87 12/26/2020 1756   HGB 16.6 10/02/2021 1519    HGB 16.2 12/08/2009 1448   HCT 48.8 10/02/2021 1519   HCT 46.2 12/08/2009 1448   PLT 187 10/02/2021 1519   MCV 95 10/02/2021 1519   MCV 95.3 12/08/2009 1448   MCH 32.3 10/02/2021 1519   MCH 32.4 12/26/2020 1756   MCHC 34.0 10/02/2021 1519   MCHC 35.1 12/26/2020 1756   RDW 12.8 10/02/2021 1519   RDW 13.2 12/08/2009 1448   LYMPHSABS 2.3 04/26/2018 2333   LYMPHSABS 2.2 12/08/2009 1448   MONOABS 0.6 04/26/2018 2333   MONOABS 0.3 12/08/2009 1448   EOSABS 0.1 04/26/2018 2333   EOSABS 0.0 12/08/2009 1448   BASOSABS 0.0 04/26/2018 2333   BASOSABS 0.0 12/08/2009 1448    ASSESSMENT AND PLAN: 1. Annual physical exam Discussed healthy eating habits.  Printed information given. Encourage exercise as tolerated but to always keep his nitroglycerin with him to use when needed.  2. Essential hypertension Not at goal.  Increase metoprolol to 37.5 mg twice a day. - losartan (COZAAR) 50 MG tablet; TAKE 1 TABLET (50 MG TOTAL) BY MOUTH DAILY.  Dispense: 90 tablet; Refill: 3 - metoprolol tartrate (LOPRESSOR) 25 MG tablet; Take 1.5 tablets (37.5 mg total) by mouth 2 (two) times daily.  Dispense: 90 tablet; Refill: 6  3. Coronary artery disease of native artery of native heart with stable angina pectoris (HCC) Stable.  Continue aspirin, metoprolol and atorvastatin.  Advised to call the cardiology office back to schedule his appointment. - atorvastatin (LIPITOR) 40 MG tablet; TAKE 1 TABLET (40 MG TOTAL) BY MOUTH DAILY.  Dispense: 90 tablet; Refill: 3  4. Overweight (BMI 25.0-29.9) See #1 above.  5. Bilateral impacted cerumen Recommend purchasing some over-the-counter wax softener and using it as needed.  6. Need for shingles vaccine We forgot to give him his Shingrix shot today.  We will give on subsequent visit.   [employer] Language interpreter used during this encounter. [id number] Look  Patient was given the opportunity to ask questions.  Patient verbalized understanding of the plan and was able  to repeat key elements of the plan.   No orders of the defined types were placed in this encounter.    Requested Prescriptions   Signed Prescriptions Disp Refills   atorvastatin (LIPITOR) 40 MG tablet 90 tablet 3    Sig: TAKE 1 TABLET (40 MG TOTAL) BY MOUTH DAILY.   losartan (COZAAR) 50 MG tablet 90 tablet 3    Sig: TAKE 1 TABLET (50 MG TOTAL) BY MOUTH DAILY.   metoprolol tartrate (LOPRESSOR) 25 MG tablet 90 tablet 6    Sig: Take 1.5 tablets (37.5 mg total) by mouth 2 (two) times daily.    Return in about 4 months (around 02/17/2022).  Karle Plumber, MD, FACP

## 2021-10-20 NOTE — Patient Instructions (Signed)
Increase metoprolol to 25 mg 1 1/2 tablets twice a day.  Try to check your blood pressure at least twice a week with goal being 130/80 or lower. Please remember to call and schedule your follow-up appointment with the cardiologist. Alimentacin saludable Healthy Eating Seguir una modalidad de alimentacin saludable puede ayudarlo a Science writer y Theatre manager un peso saludable, reducir el riesgo de tener enfermedades crnicas y vivir Ardelia Mems vida larga y productiva. Es importante que siga una modalidad de alimentacin saludable con un nivel adecuado de caloras para su cuerpo. Debe cubrir sus necesidades nutricionales principalmente a travs de los alimentos, escogiendo una variedad de alimentos ricos en nutrientes. Cules son algunos consejos para seguir este plan? Lea las etiquetas de los alimentos Lea las etiquetas y elija las que digan lo siguiente: Reducido en sodio o con bajo contenido de [place]. Jugos con 100 % jugo de fruta. Alimentos con bajo contenido de grasas saturadas y alto contenido de grasas poliinsaturadas y Veterinary surgeon. Alimentos con cereales integrales, como trigo integral, trigo partido, arroz integral y arroz salvaje. Cereales integrales fortificados con cido flico. Se recomienda a las mujeres embarazadas o que desean quedar embarazadas. Lea las etiquetas y evite: Los alimentos con una gran cantidad de Nurse, learning disability. Estos incluyen los alimentos que contienen azcar moreno, endulzante a base de maz, jarabe de maz, dextrosa, fructosa, glucosa, jarabe de maz de alta fructosa, miel, azcar invertido, lactosa, jarabe de [place], maltosa, [place], azcar sin refinar, sacarosa, trehalosa y azcar turbinado. No consuma ms que las siguientes cantidades de azcar agregada por da: 6 cucharaditas (25 g) las mujeres. 9 cucharaditas (38 g) los hombres. Los alimentos que contienen almidones y cereales refinados o procesados. Los productos de cereales refinados, como harina blanca, harina  de maz desgerminada, pan blanco y arroz blanco. Al ir de compras Elija refrigerios ricos en nutrientes, como verduras, frutas enteras y frutos secos. Evite los refrigerios con alto contenido de caloras y Location manager, como las papas fritas, los refrigerios frutales y los caramelos. Use alios y productos para untar a base de aceite con los Building surveyor de grasas slidas como la [place], la margarina en barra o el queso crema. Limite las salsas, las mezclas y los productos instantneos preelaborados como el arroz saborizado, los fideos instantneos y las pastas listas para comer. Pruebe ms fuentes de protena vegetal, como tofu, tempeh, frijoles negros, edamame, lentejas, frutos secos y semillas. Explore planes de alimentacin como la dieta mediterrnea o la dieta vegetariana. Al cocinar Use aceite para Lobbyist de grasas slidas como [place], margarina en barra o [place] de cerdo. En lugar de frer, trate de cocinar en el horno, en la plancha o en la parrilla, o hervir los alimentos. Retire la parte grasa de las carnes antes de cocinarlas. Cocine las verduras al vapor en agua o caldo. Planificacin de las comidas  En las comidas, imagine dividir su plato en cuartos: La mitad del plato tiene frutas y verduras. Un cuarto del plato tiene cereales integrales. Un cuarto del plato tiene protena, especialmente carnes [place], aves, huevos, tofu, frijoles o frutos secos. Incluya lcteos descremados en su dieta diaria. Estilo de vida Elija opciones saludables en todos los mbitos, como en el hogar, el [place], la [place], los restaurantes y [place]. Prepare los alimentos de un modo seguro: Lvese las manos despus de manipular carnes crudas. Mantenga las superficies de preparacin de los alimentos limpias lavndolas regularmente con agua caliente y [place]. Mantenga las carnes crudas separadas de los alimentos que estn  listos para comer como las frutas y las  verduras. Cocine los frutos de mar, carnes, aves y Clinical cytogeneticist la temperatura interna recomendada. Almacene los alimentos a temperaturas seguras. En general: Mantenga los alimentos fros a una temperatura de 40 F (4,4 C) o inferior. Mantenga los alimentos calientes a una temperatura de 140 F (60 C) o superior. Mantenga el congelador a una temperatura de 0 F (-17,8 C) o inferior. Los alimentos dejan de ser seguros para su consumo cuando han estado a una temperatura de entre 73 y 140 F (4,4 y 91 C) por ms de 2 horas. Qu alimentos debo consumir? Frutas Propngase comer el equivalente a 2 tazas de frutas frescas, enlatadas (en su jugo natural) o Wellsite geologist. Algunos ejemplos de equivalentes a 1 taza de frutas son 1 manzana pequea, 8 fresas grandes, 1 taza de fruta enlatada,  taza de fruta desecada o 1 taza de jugo 100 %. Verduras Propngase comer el equivalente a 2 o 3 tazas de verduras frescas y Wellsite geologist, incluyendo diferentes variedades y colores. Algunos ejemplos de equivalentes a 1 taza de verduras son 2 zanahorias medianas, 2 tazas de verduras de [employer] crudas, 1 taza de verduras cortadas (crudas o cocidas) o 1 papa mediana al horno. Granos Propngase comer el equivalente a 6 onzas de cereales integrales por da. Algunos ejemplos de equivalentes a 1 onza de cereales son 1 rebanada de pan, 1 taza de cereal listo para comer, 3 tazas de palomitas de maz o  taza de arroz, pasta o cereales cocidos. Carnes y otras protenas Propngase comer el equivalente a 5 o 6 onzas de Tourist information centre manager. Algunos ejemplos de equivalentes a 1 onza de protena son 1 huevo,  taza de frutos secos o semillas o 1 cucharada (16 g) de [place] de man. Un corte de carne o pescado del tamao de un mazo de cartas equivale aproximadamente a 3 a 4 onzas. De las protenas que consume cada semana, intente que al menos 8 onzas provengan de frutos de mar. Esto incluye el salmn, la  [place], el arenque y las [place]. Lcteos DIRECTV a 3 tazas de lcteos descremados o con bajo contenido de Public librarian. Algunos ejemplos de equivalentes a 1 taza de lcteos son 1 taza (240 ml) de leche, 8 onzas (250 g) de yogur, 1 onzas (44 g) de queso natural o 1 taza (240 ml) de leche de soja fortificada. Grasas y aceites Propngase consumir alrededor de 5 cucharaditas (21 g) por da. Elija grasas monoinsaturadas, como el aceite de canola y de [place], [place], [place] de man y Media planner de los frutos secos, o bien grasas poliinsaturadas, como el aceite de [place], maz y soja, nueces, piones, semillas de ssamo, semillas de girasol y semillas de lino. Bebidas Propngase beber seis vasos de 8 onzas de Public affairs consultant. Limite el caf a entre tres y cinco tazas de 8 onzas por Training and development officer. Limite el consumo de bebidas con cafena que tengan caloras agregadas, como los refrescos y las bebidas energizantes. Limite el consumo de alcohol a no ms de 1 medida por da si es mujer y no est Music therapist, y a 2 medidas por da si es hombre. Una medida equivale a 12 onzas de cerveza (355 ml), 5 onzas de vino (148 ml) o 1 onzas de bebidas alcohlicas de alta graduacin (44 ml). Condimentos y otros alimentos Evite agregar cantidades excesivas de sal a los alimentos. Pruebe darles sabor con hierbas y especias en  lugar de sal. Evite agregar azcar a los alimentos. Pruebe usar alios, salsas y productos untables a base de International aid/development worker de grasas slidas. Esta informacin se basa en las pautas generales de nutricin de los [employer]. [employer]. Para obtener ms informacin, visite [email or web address]. Las cantidades exactas pueden variar en funcin de sus necesidades nutricionales. Resumen Un plan de alimentacin saludable puede ayudarlo a mantener un peso saludable, reducir el riesgo de tener enfermedades crnicas y [place] activo durante toda su vida. Planifique sus comidas. Asegrese de consumir las  porciones correctas de una variedad de alimentos ricos en nutrientes. En lugar de frer, trate de cocinar en el horno, en la plancha o en la parrilla, o hervir los alimentos. Elija opciones saludables en todos los mbitos, como en el hogar, el trabajo, la [place], los restaurantes y [place]. Esta informacin no tiene Marine scientist el consejo del mdico. Asegrese de hacerle al mdico cualquier pregunta que tenga. Document Revised: 04/10/2021 Document Reviewed: 04/10/2021 Elsevier Patient Education  [place].

## 2021-10-27 ENCOUNTER — Other Ambulatory Visit: Payer: Self-pay

## 2021-11-04 ENCOUNTER — Other Ambulatory Visit: Payer: Self-pay | Admitting: Internal Medicine

## 2021-11-04 DIAGNOSIS — I25118 Atherosclerotic heart disease of native coronary artery with other forms of angina pectoris: Secondary | ICD-10-CM

## 2021-11-05 ENCOUNTER — Other Ambulatory Visit: Payer: Self-pay

## 2021-11-06 ENCOUNTER — Other Ambulatory Visit: Payer: Self-pay

## 2021-11-09 ENCOUNTER — Other Ambulatory Visit: Payer: Self-pay

## 2021-11-09 ENCOUNTER — Other Ambulatory Visit: Payer: Self-pay | Admitting: Internal Medicine

## 2021-11-09 DIAGNOSIS — I25118 Atherosclerotic heart disease of native coronary artery with other forms of angina pectoris: Secondary | ICD-10-CM

## 2021-11-11 ENCOUNTER — Other Ambulatory Visit: Payer: Self-pay

## 2021-11-11 MED ORDER — NITROGLYCERIN 0.4 MG SL SUBL
SUBLINGUAL_TABLET | SUBLINGUAL | 6 refills | Status: DC | PRN
  Filled 2021-11-11: qty 25, 10d supply, fill #0
  Filled 2022-01-02: qty 25, 10d supply, fill #1
  Filled 2022-02-03: qty 25, 10d supply, fill #2
  Filled 2022-06-17: qty 25, 10d supply, fill #3
  Filled 2022-09-02: qty 25, 10d supply, fill #4

## 2021-11-11 NOTE — Telephone Encounter (Signed)
Requested medication (s) are due for refill today:   Yes ? ?Requested medication (s) are on the active medication list:   Yes ? ?Future visit scheduled:   Yes ? ? ?Last ordered: 10/28/2020 #25, 6 refills by cardiologist ? ?Returned because no refill protocol assigned to this medication and it was prescribed by another provider  ? ?Requested Prescriptions  ?Pending Prescriptions Disp Refills  ? nitroGLYCERIN (NITROSTAT) 0.4 MG SL tablet 25 tablet 6  ?  Sig: PLACE 1 TABLET (0.4 MG TOTAL) UNDER THE TONGUE EVERY 5 (FIVE) MINUTES X 3 DOSES AS NEEDED FOR CHEST PAIN.  ?  ? There is no refill protocol information for this order  ?  ? ?

## 2021-11-13 ENCOUNTER — Other Ambulatory Visit: Payer: Self-pay

## 2021-12-04 ENCOUNTER — Other Ambulatory Visit: Payer: Self-pay

## 2022-01-04 ENCOUNTER — Other Ambulatory Visit: Payer: Self-pay

## 2022-01-08 ENCOUNTER — Other Ambulatory Visit: Payer: Self-pay

## 2022-01-15 ENCOUNTER — Ambulatory Visit: Payer: BC Managed Care – PPO | Admitting: Internal Medicine

## 2022-01-15 ENCOUNTER — Telehealth: Payer: Self-pay | Admitting: *Deleted

## 2022-01-15 ENCOUNTER — Encounter: Payer: Self-pay | Admitting: Internal Medicine

## 2022-01-15 VITALS — BP 130/82 | HR 67 | Ht 67.0 in | Wt 170.6 lb

## 2022-01-15 DIAGNOSIS — I214 Non-ST elevation (NSTEMI) myocardial infarction: Secondary | ICD-10-CM

## 2022-01-15 DIAGNOSIS — E785 Hyperlipidemia, unspecified: Secondary | ICD-10-CM

## 2022-01-15 DIAGNOSIS — I25118 Atherosclerotic heart disease of native coronary artery with other forms of angina pectoris: Secondary | ICD-10-CM

## 2022-01-15 DIAGNOSIS — I10 Essential (primary) hypertension: Secondary | ICD-10-CM

## 2022-01-15 DIAGNOSIS — G4733 Obstructive sleep apnea (adult) (pediatric): Secondary | ICD-10-CM

## 2022-01-15 NOTE — Patient Instructions (Signed)
Medication Instructions:  No Changes In Medications at this time.  *If you need a refill on your cardiac medications before your next appointment, please call your pharmacy*   Testing/Procedures: Your physician has recommended that you have a sleep study. This test records several body functions during sleep, including: brain activity, eye movement, oxygen and carbon dioxide blood levels, heart rate and rhythm, breathing rate and rhythm, the flow of air through your mouth and nose, snoring, body muscle movements, and chest and belly movement.  Follow-Up: At [facility], you and your health needs are our priority.  As part of our continuing mission to provide you with exceptional heart care, we have created designated Provider Care Teams.  These Care Teams include your primary Cardiologist (physician) and Advanced Practice Providers (APPs -  Physician Assistants and Nurse Practitioners) who all work together to provide you with the care you need, when you need it.  We recommend signing up for the patient portal called "MyChart".  Sign up information is provided on this After Visit Summary.  MyChart is used to connect with patients for Virtual Visits (Telemedicine).  Patients are able to view lab/test results, encounter notes, upcoming appointments, etc.  Non-urgent messages can be sent to your provider as well.   To learn more about what you can do with MyChart, go to [email or web address].    Your next appointment:   1 year(s)  The format for your next appointment:   In Person  Provider:   Parke Poisson, MD

## 2022-01-15 NOTE — Progress Notes (Signed)
Cardiology Office Note:    Date:  01/15/2022   ID:  Andrew Hunter, DOB 05-10-1961, MRN [id number]  PCP:  Marcine MatarJohnson, Deborah B, MD  Cardiologist:  Parke PoissonGayatri A Jorel Gravlin, MD  Electrophysiologist:  None   Referring MD: Marcine MatarJohnson, Deborah B, MD   Chief Complaint/Reason for Referral: CAD, prior NSTEMI  History of Present Illness:    Andrew Hunter is a 61 y.o. male with a history of NSTEMI s/p PCI to mid LAD and residual ostial first marginal 60-70% stenosis felt best for medical management at time of ACS. PCI on 10/05/2018.  Completed 1 year of DAPT.   He mentions he has never received the sleep mask he paid money for independently.  He feels a repeat sleep study may be indicated since his last 1 was greater than 10 years ago to assist with obtaining new equipment that will be covered.  We discussed home sleep test   Intermittently needs nitroglycerin but is also not taking Imdur daily since he feels he does not need it all the time.  We discussed the long-acting effect of Imdur and as needed nitroglycerin and titration of medical therapy for angina.  He will try to take Imdur daily and takes it at noon often.  Past Medical History:  Diagnosis Date   Coronary artery disease    Essential hypertension     Past Surgical History:  Procedure Laterality Date   CORONARY STENT INTERVENTION N/A 10/05/2018   Procedure: CORONARY STENT INTERVENTION;  Surgeon: Runell GessBerry, Jonathan J, MD;  Location: [facility];  Service: Cardiovascular;  Laterality: N/A;  MID LAD   INTRAVASCULAR PRESSURE WIRE/FFR STUDY N/A 10/05/2018   Procedure: INTRAVASCULAR PRESSURE WIRE/FFR STUDY;  Surgeon: Runell GessBerry, Jonathan J, MD;  Location: [facility];  Service: Cardiovascular;  Laterality: N/A;  FFR LAD   LEFT HEART CATH AND CORONARY ANGIOGRAPHY N/A 10/05/2018   Procedure: LEFT HEART CATH AND CORONARY ANGIOGRAPHY;  Surgeon: Runell GessBerry, Jonathan J, MD;  Location: [facility];  Service: Cardiovascular;  Laterality: N/A;     Current Medications: Current Meds  Medication Sig   acetaminophen (TYLENOL) 500 MG tablet Take 500-1,000 mg by mouth every 6 (six) hours as needed (for pain or headaches).   aspirin EC 81 MG tablet Take 1 tablet (81 mg total) by mouth daily.   atorvastatin (LIPITOR) 40 MG tablet TAKE 1 TABLET (40 MG TOTAL) BY MOUTH DAILY.   calcium carbonate (TUMS - DOSED IN MG ELEMENTAL CALCIUM) 500 MG chewable tablet Chew 1 tablet by mouth daily as needed for indigestion or heartburn.    cholecalciferol (VITAMIN D3) 25 MCG (1000 UNIT) tablet Take 1,000 Units by mouth daily.   isosorbide mononitrate (IMDUR) 60 MG 24 hr tablet TAKE 1 TABLET (60 MG TOTAL) BY MOUTH DAILY.   losartan (COZAAR) 50 MG tablet TAKE 1 TABLET (50 MG TOTAL) BY MOUTH DAILY.   metoprolol tartrate (LOPRESSOR) 25 MG tablet Take 1.5 tablets (37.5 mg total) by mouth 2 (two) times daily.   nitroGLYCERIN (NITROSTAT) 0.4 MG SL tablet PLACE 1 TABLET (0.4 MG TOTAL) UNDER THE TONGUE EVERY 5 (FIVE) MINUTES X 3 DOSES AS NEEDED FOR CHEST PAIN.   Omega-3 Fatty Acids (FISH OIL PO) Take 1 capsule by mouth daily.   tamsulosin (FLOMAX) 0.4 MG CAPS capsule Take 1 capsule (0.4 mg total) by mouth daily.     Allergies:   Patient has no known allergies.   Social History   Tobacco Use   Smoking status: Never   Smokeless tobacco:  Never  Vaping Use   Vaping Use: Never used  Substance Use Topics   Alcohol use: Yes    Comment: 2 beers/month   Drug use: Never     Family History: The patient's family history includes Cancer in his mother; Heart attack in his father.  ROS:   Please see the history of present illness.    All other systems reviewed and are negative.  EKGs/Labs/Other Studies Reviewed:    The following studies were reviewed today:  EKG: Normal sinus rhythm, nonspecific intraventricular conduction delay, QRS duration 126 ms.   Previously noted to have right bundle branch block on last ECG.   Recent Labs: 10/02/2021: ALT 31; BUN  20; Creatinine, Ser 1.04; Hemoglobin 16.6; Platelets 187; Potassium 4.6; Sodium 140  Recent Lipid Panel    Component Value Date/Time   CHOL 123 10/02/2021 1519   TRIG 97 10/02/2021 1519   HDL 56 10/02/2021 1519   CHOLHDL 2.2 10/02/2021 1519   CHOLHDL 3.6 10/05/2018 0252   VLDL 33 10/05/2018 0252   LDLCALC 49 10/02/2021 1519    Physical Exam:    VS:  BP 130/82 (BP Location: Left Arm, Patient Position: Sitting, Cuff Size: Normal)   Pulse 67   Ht 5\' 7"  (1.702 m)   Wt 170 lb 9.6 oz (77.4 kg)   BMI 26.72 kg/m     Wt Readings from Last 5 Encounters:  01/15/22 170 lb 9.6 oz (77.4 kg)  10/20/21 172 lb 9.6 oz (78.3 kg)  10/02/21 170 lb 9.6 oz (77.4 kg)  12/19/20 171 lb (77.6 kg)  10/28/20 176 lb (79.8 kg)    Constitutional: No acute distress Eyes: sclera non-icteric, normal conjunctiva and lids ENMT: normal dentition, moist mucous membranes Cardiovascular: regular rhythm, normal rate, no murmurs. S1 and S2 normal. Radial pulses normal bilaterally. No jugular venous distention.  Respiratory: clear to auscultation bilaterally GI : normal bowel sounds, soft and nontender. No distention.   MSK: extremities warm, well perfused. No edema.  NEURO: grossly nonfocal exam, moves all extremities. PSYCH: alert and oriented x 3, normal mood and affect.   ASSESSMENT:    1. NSTEMI (non-ST elevated myocardial infarction) [facility]): February 2020, DES to LAD   2. Coronary artery disease of native artery of native heart with stable angina pectoris (HCC)   3. Essential hypertension   4. Hyperlipidemia with target LDL less than 70   5. OSA (obstructive sleep apnea)    PLAN:    NSTEMI (non-ST elevated myocardial infarction) [facility]): February 2020, DES to LAD Coronary artery disease of native artery of native heart with stable angina pectoris (HCC) Chest pain, unspecified type -Chest pain has improved on increased dose of Imdur.  Continue aspirin, atorvastatin, isosorbide, losartan, metoprolol at  current doses.  He would like to consolidate his dose of metoprolol, but is not interested in increasing the dose for consolidation purposes.  He finds it difficult to split the pills, but would not want to increase to 50 mg twice daily.  Essential hypertension-stable on current regimen, we discussed dose up titration for optimization of blood pressure but he is not interested in medication uptitration today and remains concerned about medication side effects.  Continue current doses.  Hyperlipidemia with target LDL less than 70-lipids well controlled on atorvastatin 40 mg daily.  Continue.  Patient notes he does not take it every single day and I have discussed in great detail secondary prevention of CAD and indications for continued statin therapy.  OSA (obstructive sleep apnea)-he has had  difficulty obtaining his CPAP mask which he paid personally for, and would like repeat sleep assessment to assist with coverage of a new device.  We will arrange home sleep test.  Patient has a history of OSA.   Total time of encounter: 30 minutes total time of encounter, including 20 minutes spent in face-to-face patient care on the date of this encounter. This time includes coordination of care and counseling regarding above mentioned problem list. Remainder of non-face-to-face time involved reviewing chart documents/testing relevant to the patient encounter and documentation in the medical record. I have independently reviewed documentation from referring provider.   Weston Brass, MD, [facility]   [facility]    Medication Adjustments/Labs and Tests Ordered: Current medicines are reviewed at length with the patient today.  Concerns regarding medicines are outlined above.   Orders Placed This Encounter  Procedures   EKG 12-Lead   Home sleep test    No orders of the defined types were placed in this encounter.   Patient Instructions  Medication Instructions:  No Changes In Medications  at this time.  *If you need a refill on your cardiac medications before your next appointment, please call your pharmacy*   Testing/Procedures: Your physician has recommended that you have a sleep study. This test records several body functions during sleep, including: brain activity, eye movement, oxygen and carbon dioxide blood levels, heart rate and rhythm, breathing rate and rhythm, the flow of air through your mouth and nose, snoring, body muscle movements, and chest and belly movement.  Follow-Up: At [facility], you and your health needs are our priority.  As part of our continuing mission to provide you with exceptional heart care, we have created designated Provider Care Teams.  These Care Teams include your primary Cardiologist (physician) and Advanced Practice Providers (APPs -  Physician Assistants and Nurse Practitioners) who all work together to provide you with the care you need, when you need it.  We recommend signing up for the patient portal called "MyChart".  Sign up information is provided on this After Visit Summary.  MyChart is used to connect with patients for Virtual Visits (Telemedicine).  Patients are able to view lab/test results, encounter notes, upcoming appointments, etc.  Non-urgent messages can be sent to your provider as well.   To learn more about what you can do with MyChart, go to [email or web address].    Your next appointment:   1 year(s)  The format for your next appointment:   In Person  Provider:   Parke Poisson, MD

## 2022-01-15 NOTE — Telephone Encounter (Signed)
Prior Authorization for HST sent to [facility] via web portal. Auth # [id number]. Valid dates 01/15/22 to 03/15/22.

## 2022-02-04 ENCOUNTER — Other Ambulatory Visit: Payer: Self-pay

## 2022-02-09 ENCOUNTER — Other Ambulatory Visit: Payer: Self-pay

## 2022-02-19 ENCOUNTER — Encounter: Payer: Self-pay | Admitting: Internal Medicine

## 2022-02-19 ENCOUNTER — Ambulatory Visit: Payer: BC Managed Care – PPO | Attending: Internal Medicine | Admitting: Internal Medicine

## 2022-02-19 VITALS — BP 150/80 | HR 67 | Wt 171.8 lb

## 2022-02-19 DIAGNOSIS — G4733 Obstructive sleep apnea (adult) (pediatric): Secondary | ICD-10-CM

## 2022-02-19 DIAGNOSIS — I10 Essential (primary) hypertension: Secondary | ICD-10-CM | POA: Diagnosis not present

## 2022-02-19 DIAGNOSIS — E663 Overweight: Secondary | ICD-10-CM

## 2022-02-19 DIAGNOSIS — I25118 Atherosclerotic heart disease of native coronary artery with other forms of angina pectoris: Secondary | ICD-10-CM | POA: Diagnosis not present

## 2022-02-19 DIAGNOSIS — Z23 Encounter for immunization: Secondary | ICD-10-CM

## 2022-02-19 DIAGNOSIS — Z9989 Dependence on other enabling machines and devices: Secondary | ICD-10-CM

## 2022-02-19 NOTE — Progress Notes (Signed)
Patient ID: Andrew Hunter, male    DOB: Dec 02, 1960  MRN: [id number]  CC: Hypertension   Subjective: Andrew Hunter is a 61 y.o. male who presents for chronic ds management His concerns today include:  Patient with history of HTN, preDM, CAD with hx of NSTEMI, GERD, OSA CPAP but not using consistently, BPH, urge incont   HTN/CAD:  Patient reports compliance with taking metoprolol, isosorbide, Cozaar, atorvastatin and aspirin.  Took BP meds already today Checks BP occasionally  Has stable angina symptoms about once a wk. Last used SL Nitor 2-3 days ago.  SOB sometimes when he sings but no shortness of breath at rest or with exertion.  Saw his cardiologist last month.  He informed her that he is not sleeping well because the mask for his CPAP machine leaks air so he has not been able to use his CPAP.  Cardiology has ordered a new sleep study for him.  He has had the CPAP machine for 1 year. Weight is stable.  He is a little overweight for height.  Reports he does well with eating habits and incorporates fruits and vegetables into his diet.  He does a lot of walking at work and also plans to start walking for 30 minutes most days.  HM: declines 2nd Shingrix shot.   Patient Active Problem List   Diagnosis Date Noted   Bilateral impacted cerumen 08/07/2020   Benign prostatic hyperplasia with lower urinary tract symptoms 08/07/2020   Urge incontinence of urine 08/07/2020   OSA on CPAP 08/07/2020   Overweight (BMI 25.0-29.9) 08/07/2020   Prediabetes 05/27/2019   Essential hypertension 11/02/2018   Gastroesophageal reflux disease without esophagitis 11/02/2018   Coronary artery disease of native artery of native heart with stable angina pectoris (HCC) 11/02/2018   NSTEMI (non-ST elevated myocardial infarction) (HCC) 10/04/2018     Current Outpatient Medications on File Prior to Visit  Medication Sig Dispense Refill   acetaminophen (TYLENOL) 500 MG tablet Take 500-1,000 mg by mouth every 6  (six) hours as needed (for pain or headaches).     aspirin EC 81 MG tablet Take 1 tablet (81 mg total) by mouth daily. 90 tablet 3   atorvastatin (LIPITOR) 40 MG tablet TAKE 1 TABLET (40 MG TOTAL) BY MOUTH DAILY. 90 tablet 3   calcium carbonate (TUMS - DOSED IN MG ELEMENTAL CALCIUM) 500 MG chewable tablet Chew 1 tablet by mouth daily as needed for indigestion or heartburn.      cholecalciferol (VITAMIN D3) 25 MCG (1000 UNIT) tablet Take 1,000 Units by mouth daily.     isosorbide mononitrate (IMDUR) 60 MG 24 hr tablet TAKE 1 TABLET (60 MG TOTAL) BY MOUTH DAILY. 90 tablet 2   losartan (COZAAR) 50 MG tablet TAKE 1 TABLET (50 MG TOTAL) BY MOUTH DAILY. 90 tablet 3   metoprolol tartrate (LOPRESSOR) 25 MG tablet Take 1.5 tablets (37.5 mg total) by mouth 2 (two) times daily. 90 tablet 6   nitroGLYCERIN (NITROSTAT) 0.4 MG SL tablet PLACE 1 TABLET (0.4 MG TOTAL) UNDER THE TONGUE EVERY 5 (FIVE) MINUTES X 3 DOSES AS NEEDED FOR CHEST PAIN. 25 tablet 6   Omega-3 Fatty Acids (FISH OIL PO) Take 1 capsule by mouth daily.     tamsulosin (FLOMAX) 0.4 MG CAPS capsule Take 1 capsule (0.4 mg total) by mouth daily. 30 capsule 3   No current facility-administered medications on file prior to visit.    No Known Allergies  Social History   Socioeconomic History  Marital status: Single    Spouse name: Not on file   Number of children: Not on file   Years of education: Not on file   Highest education level: Not on file  Occupational History   Not on file  Tobacco Use   Smoking status: Never   Smokeless tobacco: Never  Vaping Use   Vaping Use: Never used  Substance and Sexual Activity   Alcohol use: Yes    Comment: 2 beers/month   Drug use: Never   Sexual activity: Yes  Other Topics Concern   Not on file  Social History Narrative   Lives locally with wife.  Does not routinely exercise.  Works in Therapist, sports.   Social Determinants of Health   Financial Resource Strain: Not on file  Food Insecurity:  Not on file  Transportation Needs: Not on file  Physical Activity: Not on file  Stress: Not on file  Social Connections: Not on file  Intimate Partner Violence: Not on file    Family History  Problem Relation Age of Onset   Cancer Mother        liver   Heart attack Father        died @ 17    Past Surgical History:  Procedure Laterality Date   CORONARY STENT INTERVENTION N/A 10/05/2018   Procedure: CORONARY STENT INTERVENTION;  Surgeon: Runell Gess, MD;  Location: [facility];  Service: Cardiovascular;  Laterality: N/A;  MID LAD   INTRAVASCULAR PRESSURE WIRE/FFR STUDY N/A 10/05/2018   Procedure: INTRAVASCULAR PRESSURE WIRE/FFR STUDY;  Surgeon: Runell Gess, MD;  Location: [facility];  Service: Cardiovascular;  Laterality: N/A;  FFR LAD   LEFT HEART CATH AND CORONARY ANGIOGRAPHY N/A 10/05/2018   Procedure: LEFT HEART CATH AND CORONARY ANGIOGRAPHY;  Surgeon: Runell Gess, MD;  Location: [facility];  Service: Cardiovascular;  Laterality: N/A;    ROS: Review of Systems Negative except as stated above  PHYSICAL EXAM: BP (!) 168/87   Pulse 67   Wt 171 lb 12.8 oz (77.9 kg)   SpO2 97%   BMI 26.91 kg/m   Wt Readings from Last 3 Encounters:  02/19/22 171 lb 12.8 oz (77.9 kg)  01/15/22 170 lb 9.6 oz (77.4 kg)  10/20/21 172 lb 9.6 oz (78.3 kg)    Physical Exam  General appearance - alert, well appearing, older hispanic male and in no distress Mental status - normal mood, behavior, speech, dress, motor activity, and thought processes Neck - supple, no significant adenopathy Chest - clear to auscultation, no wheezes, rales or rhonchi, symmetric air entry Heart - normal rate, regular rhythm, normal S1, S2, no murmurs, rubs, clicks or gallops Extremities - peripheral pulses normal, no pedal edema, no clubbing or cyanosis      Latest Ref Rng & Units 10/02/2021    3:19 PM 12/26/2020    5:56 PM 08/07/2020    4:43 PM  CMP  Glucose 70 - 99 mg/dL  89  035  597   BUN 8 - 27 mg/dL 20  17  15    Creatinine 0.76 - 1.27 mg/dL  4.16  3.84   Sodium 134 - 144 mmol/L 140  133  139   Potassium 3.5 - 5.2 mmol/L 4.6  3.4  4.5   Chloride 96 - 106 mmol/L 101  100  103   CO2 20 - 29 mmol/L 27  28  27    Calcium 8.6 - 10.2 mg/dL 9.7  9.1  9.3   Total Protein 6.0 - 8.5 g/dL 7.4   7.1   Total Bilirubin 0.0 - 1.2 mg/dL 1.5   1.2   Alkaline Phos 44 - 121 IU/L 96   105   AST 0 - 40 IU/L 23   28   ALT 0 - 44 IU/L 31   26    Lipid Panel     Component Value Date/Time   CHOL 123 10/02/2021 1519   TRIG 97 10/02/2021 1519   HDL 56 10/02/2021 1519   CHOLHDL 2.2 10/02/2021 1519   CHOLHDL 3.6 10/05/2018 0252   VLDL 33 10/05/2018 0252   LDLCALC 49 10/02/2021 1519    CBC    Component Value Date/Time   WBC 6.4 10/02/2021 1519   WBC 9.3 12/26/2020 1756   RBC 5.14 10/02/2021 1519   RBC 4.87 12/26/2020 1756   HGB 16.6 10/02/2021 1519   HGB 16.2 12/08/2009 1448   HCT 48.8 10/02/2021 1519   HCT 46.2 12/08/2009 1448   PLT 187 10/02/2021 1519   MCV 95 10/02/2021 1519   MCV 95.3 12/08/2009 1448   MCH 32.3 10/02/2021 1519   MCH 32.4 12/26/2020 1756   MCHC 34.0 10/02/2021 1519   MCHC 35.1 12/26/2020 1756   RDW 12.8 10/02/2021 1519   RDW 13.2 12/08/2009 1448   LYMPHSABS 2.3 04/26/2018 2333   LYMPHSABS 2.2 12/08/2009 1448   MONOABS 0.6 04/26/2018 2333   MONOABS 0.3 12/08/2009 1448   EOSABS 0.1 04/26/2018 2333   EOSABS 0.0 12/08/2009 1448   BASOSABS 0.0 04/26/2018 2333   BASOSABS 0.0 12/08/2009 1448    ASSESSMENT AND PLAN: 1. Essential hypertension Repeat blood pressure today is not at goal but improved.  Advised to continue metoprolol 37.5 mg twice a day, isosorbide 60 mg daily, Cozaar 50 mg daily.  Advised to check blood pressure at least twice a week and record the readings.  Follow-up with clinical pharmacist in 1 month for blood pressure recheck.  2. Coronary artery disease of native artery of native heart with stable angina pectoris  (HCC) Stable.  Continue aspirin, metoprolol, and Lipitor 40 mg daily.  3. OSA on CPAP Discussed referring him for mask desensitization.  It sounds as though this is what he needs rather than repeat sleep study given that his CPAP machine is only a year old.  Patient is agreeable to this. - Desensitization mask fit; Future  4. Overweight (BMI 25.0-29.9) Encouraged him to continue healthy eating habits and regular exercise.  Advised to always have his nitroglycerin with him to use if needed.  5. Need for shingles vaccine Due for second shingles vaccine but patient declines.   [employer] Language interpreter used during this encounter. #[id number] Fam.  Used as stand-by only as pt speaks and understands english fairly well  Patient was given the opportunity to ask questions.  Patient verbalized understanding of the plan and was able to repeat key elements of the plan.   This documentation was completed using Paediatric nurse.  Any transcriptional errors are unintentional.  No orders of the defined types were placed in this encounter.    Requested Prescriptions    No prescriptions requested or ordered in this encounter    No follow-ups on file.  Jonah Blue, MD, FACP

## 2022-03-31 ENCOUNTER — Other Ambulatory Visit: Payer: Self-pay | Admitting: Internal Medicine

## 2022-04-01 ENCOUNTER — Other Ambulatory Visit: Payer: Self-pay

## 2022-05-04 ENCOUNTER — Other Ambulatory Visit: Payer: Self-pay

## 2022-05-05 ENCOUNTER — Other Ambulatory Visit: Payer: Self-pay

## 2022-06-18 ENCOUNTER — Other Ambulatory Visit: Payer: Self-pay

## 2022-06-25 ENCOUNTER — Ambulatory Visit: Payer: BC Managed Care – PPO | Admitting: Internal Medicine

## 2022-07-18 IMAGING — US US SCROTUM W/ DOPPLER COMPLETE
1 series · 14 of 25 positions shown · non-contrast
Comparison: None.

CLINICAL DATA: Left testicular pain beginning 3 days ago.

EXAM:
SCROTAL ULTRASOUND
DOPPLER ULTRASOUND OF THE TESTICLES
TECHNIQUE: Complete ultrasound examination of the testicles, epididymis, and
other scrotal structures was performed. Color and spectral Doppler
ultrasound were also utilized to evaluate blood flow to the
testicles.

[Series 1: us scrotum w/doppler · 14 of 80 slices shown]
[im 1/80]
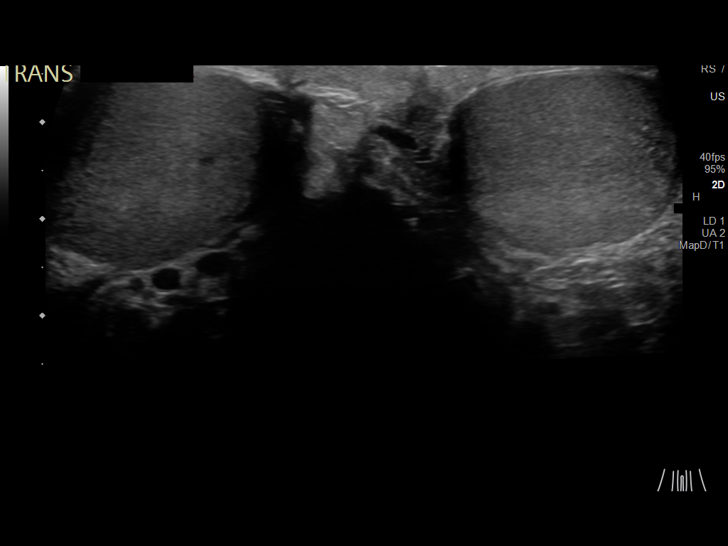
[im 7/80]
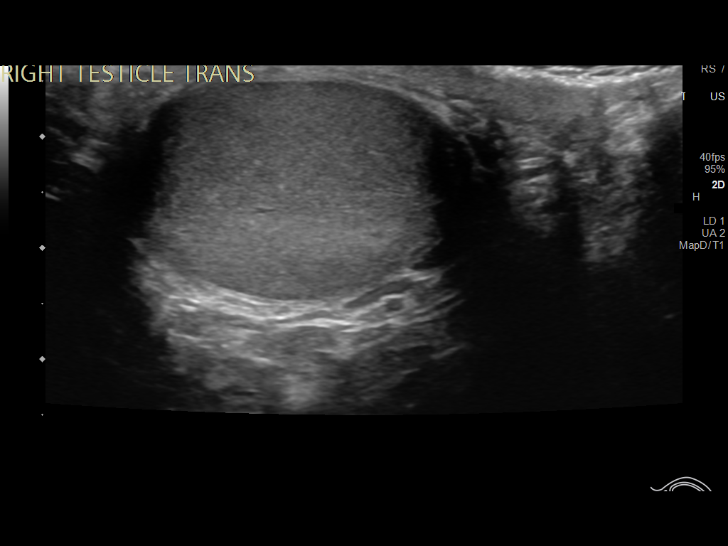
[im 14/80]
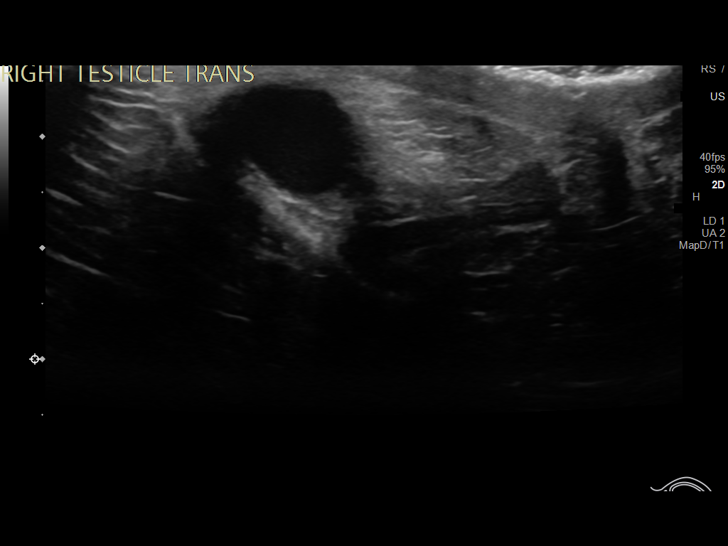
[im 20/80]
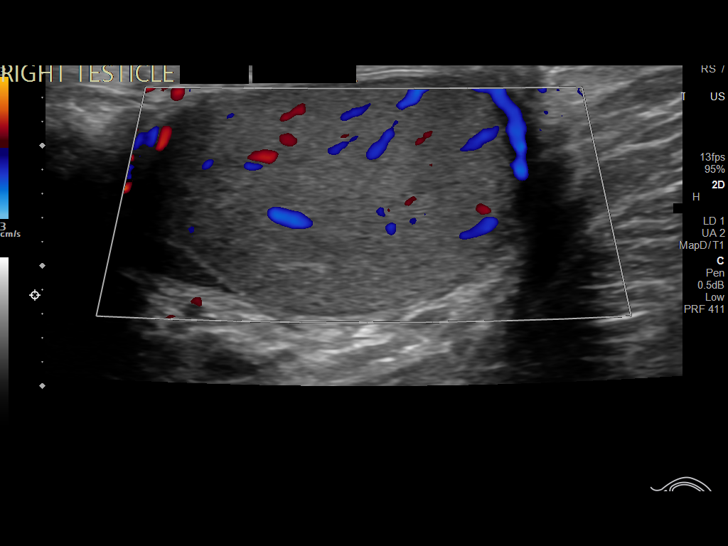
[im 27/80]
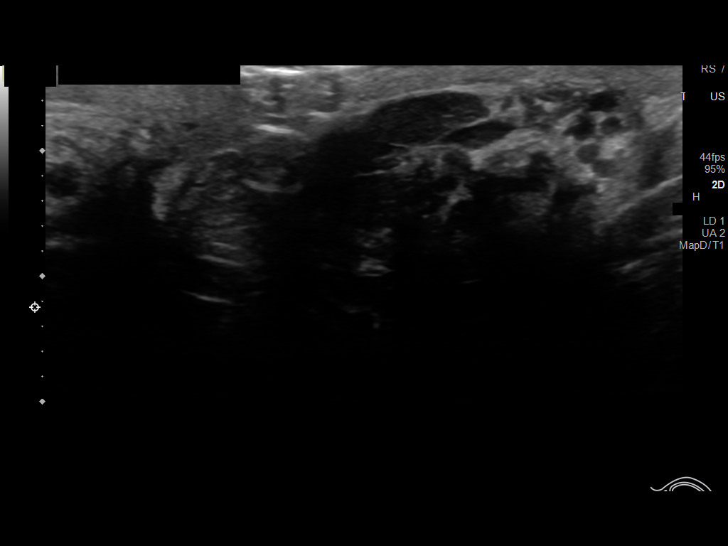
[im 30/80]
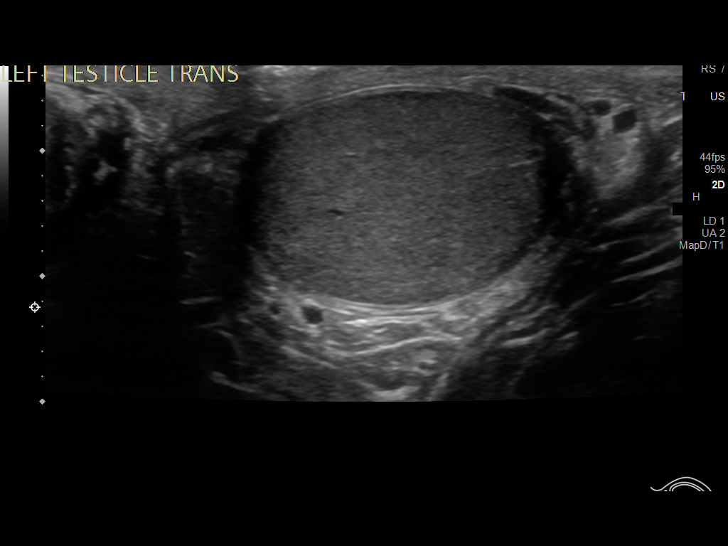
[im 37/80]
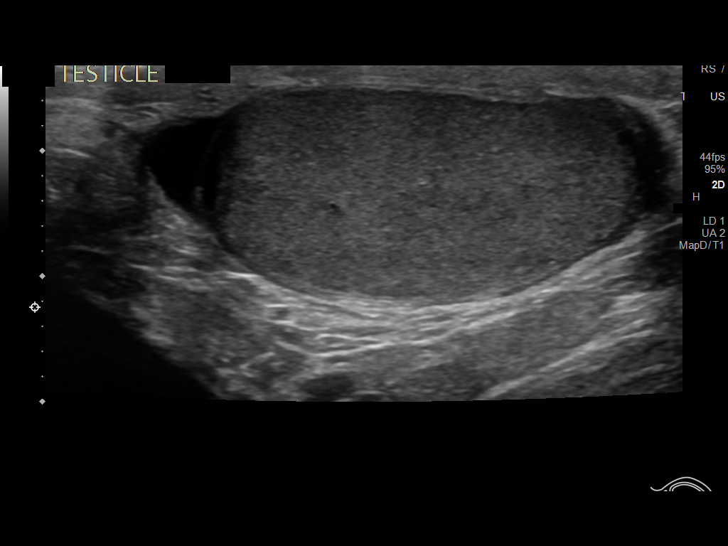
[im 43/80]
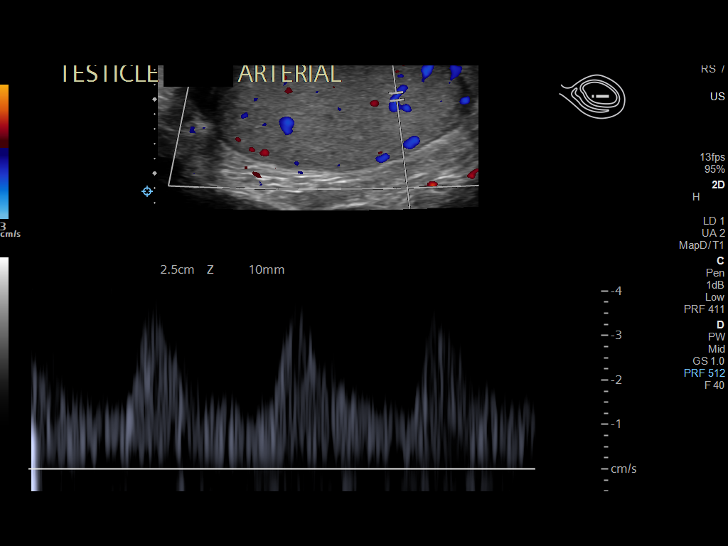
[im 50/80]
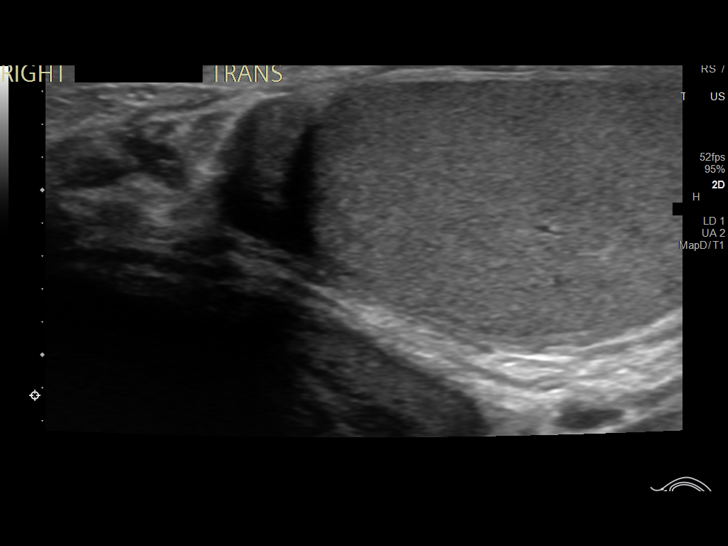
[im 53/80]
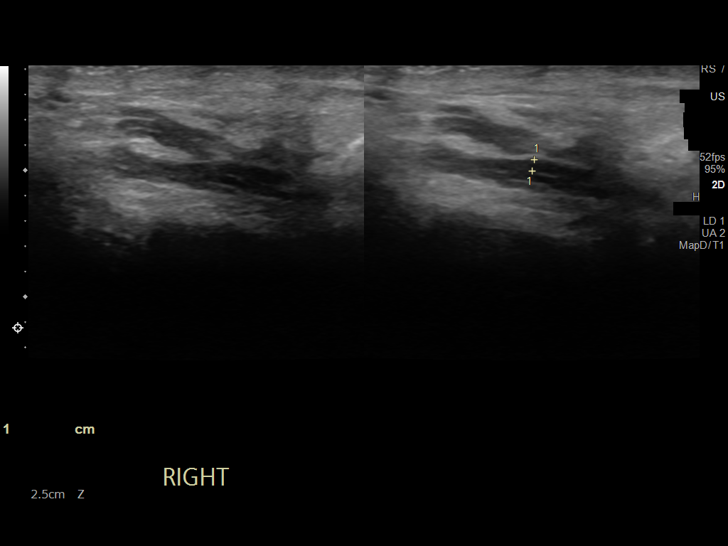
[im 60/80]
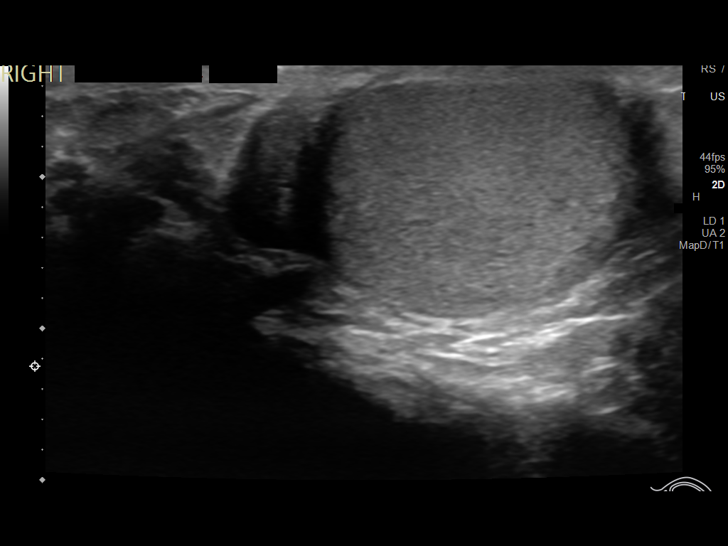
[im 66/80]
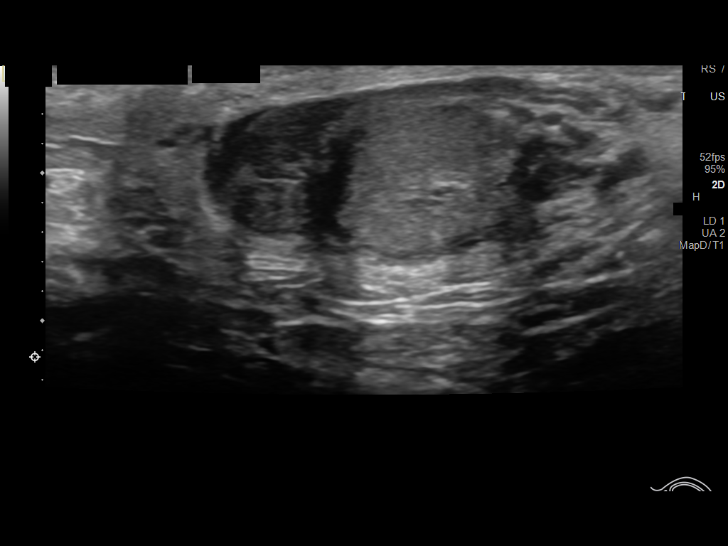
[im 73/80]
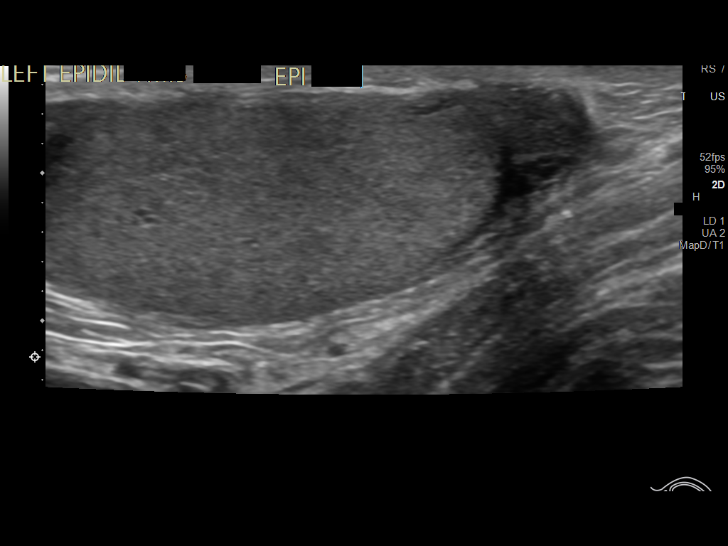
[im 80/80]
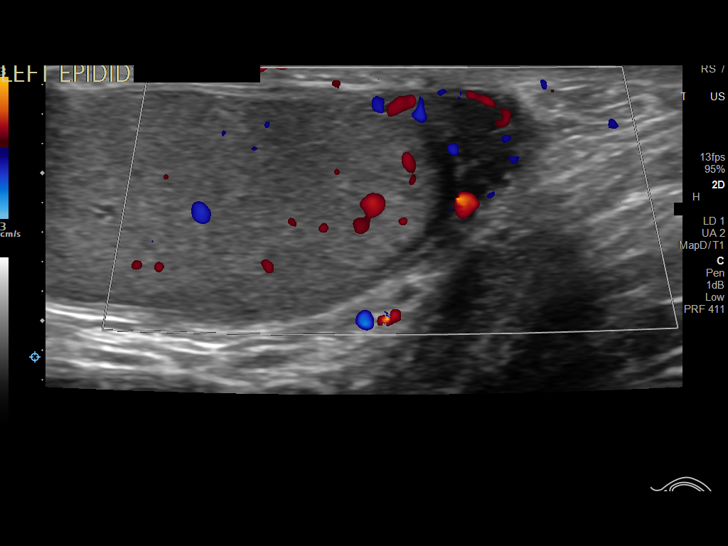

[14 of 25 positions shown; findings below may reference images not displayed]

FINDINGS: Right testicle

Measurements: 3.1 x 2.0 x 2.6 cm. Normal appearance. No focal
lesion. No hyperemia. Normal Doppler and color flow appearance.

Left testicle

Measurements: 3.4 x 1.7 x 2.4 cm. No focal lesion. No hyperemia.
Normal Doppler and color flow appearance.

Right epididymis:  Normal in size and appearance.

Left epididymis:  Normal in size and appearance.

Hydrocele:  Small bilateral hydroceles, likely subclinical.

Varicocele:  None visualized.

Pulsed Doppler interrogation of both testes demonstrates normal low
resistance arterial and venous waveforms bilaterally.
IMPRESSION: Normal appearance of the testicles and epididymi. No evidence of
focal lesion or inflammatory disease. Small bilateral hydroceles,
likely subclinical. No abnormality seen to explain left-sided
symptoms.

## 2022-07-30 ENCOUNTER — Other Ambulatory Visit: Payer: Self-pay

## 2022-08-02 ENCOUNTER — Other Ambulatory Visit (HOSPITAL_COMMUNITY): Payer: Self-pay

## 2022-08-02 ENCOUNTER — Other Ambulatory Visit: Payer: Self-pay

## 2022-08-20 ENCOUNTER — Ambulatory Visit: Payer: BC Managed Care – PPO | Admitting: Internal Medicine

## 2022-08-25 ENCOUNTER — Ambulatory Visit: Payer: BC Managed Care – PPO | Admitting: Physician Assistant

## 2022-09-03 ENCOUNTER — Other Ambulatory Visit: Payer: Self-pay

## 2022-09-08 ENCOUNTER — Other Ambulatory Visit: Payer: Self-pay

## 2022-10-08 ENCOUNTER — Other Ambulatory Visit: Payer: Self-pay

## 2022-10-22 ENCOUNTER — Encounter: Payer: Self-pay | Admitting: Internal Medicine

## 2022-10-22 ENCOUNTER — Ambulatory Visit: Payer: BC Managed Care – PPO | Attending: Internal Medicine | Admitting: Internal Medicine

## 2022-10-22 ENCOUNTER — Other Ambulatory Visit: Payer: Self-pay

## 2022-10-22 VITALS — BP 163/82 | HR 86 | Temp 98.3°F | Ht 67.0 in | Wt 170.0 lb

## 2022-10-22 DIAGNOSIS — E663 Overweight: Secondary | ICD-10-CM | POA: Diagnosis not present

## 2022-10-22 DIAGNOSIS — Z125 Encounter for screening for malignant neoplasm of prostate: Secondary | ICD-10-CM

## 2022-10-22 DIAGNOSIS — G4733 Obstructive sleep apnea (adult) (pediatric): Secondary | ICD-10-CM

## 2022-10-22 DIAGNOSIS — N400 Enlarged prostate without lower urinary tract symptoms: Secondary | ICD-10-CM

## 2022-10-22 DIAGNOSIS — Z0001 Encounter for general adult medical examination with abnormal findings: Secondary | ICD-10-CM

## 2022-10-22 DIAGNOSIS — I25118 Atherosclerotic heart disease of native coronary artery with other forms of angina pectoris: Secondary | ICD-10-CM

## 2022-10-22 DIAGNOSIS — Z0000 Encounter for general adult medical examination without abnormal findings: Secondary | ICD-10-CM

## 2022-10-22 DIAGNOSIS — I10 Essential (primary) hypertension: Secondary | ICD-10-CM | POA: Diagnosis not present

## 2022-10-22 DIAGNOSIS — R7303 Prediabetes: Secondary | ICD-10-CM

## 2022-10-22 DIAGNOSIS — Z2821 Immunization not carried out because of patient refusal: Secondary | ICD-10-CM

## 2022-10-22 DIAGNOSIS — Z6825 Body mass index (BMI) 25.0-25.9, adult: Secondary | ICD-10-CM

## 2022-10-22 MED ORDER — ROSUVASTATIN CALCIUM 20 MG PO TABS
20.0000 mg | ORAL_TABLET | Freq: Every day | ORAL | 6 refills | Status: DC
  Filled 2022-10-22: qty 30, 30d supply, fill #0

## 2022-10-22 MED ORDER — ISOSORBIDE MONONITRATE ER 30 MG PO TB24
15.0000 mg | ORAL_TABLET | Freq: Every day | ORAL | 1 refills | Status: DC
  Filled 2022-10-22: qty 45, 90d supply, fill #0

## 2022-10-22 NOTE — Progress Notes (Signed)
Patient ID: Andrew Hunter, male    DOB: [birth year]  MRN: [id number]  CC: Annual Exam (Annual physical. Med refill. Shann Medal that atorvastatin gave stinging sensation in heart area. No longer taking. /Pt already received flu vax at work. )   Subjective: Andrew Hunter is a 62 y.o. male who presents for annual exam His concerns today include:  Patient with history of HTN, preDM, CAD with hx of NSTEMI, GERD, OSA CPAP but not using consistently, BPH, urge incont   HYPERTENSION/CAD Currently taking: see medication list.  Should be on metoprolol, isosorbide, Cozaar, atorvastatin and aspirin.  Tells me if his BP is around 116 in mornings he does not take any of his medications. Did not take meds as yet for today.  He stopped Lipitor because it causes pain in his heart Med Adherence: '[]'$  Yes    '[]'$  No Medication side effects: '[]'$  Yes    '[]'$  No Adherence with salt restriction: '[x]'$  Yes    '[]'$  No Home Monitoring?: '[x]'$  Yes    '[]'$  No Monitoring Frequency: every morning with arm device Home BP results range: 116-120/76-85 SOB? '[]'$  Yes    '[x]'$  No Chest Pain?: '[x]'$  Yes -daily which he rates 1-2/10 when exerting himself at work.  Works with sheet metal. However, no pain when he rides his bike. Goes away with drinking a bottle of water. Last uses SL Nitro 1 mth ago.  Stopped Isosorbide a while ago.  States he was told by his cardiologist Dr. Zenia Resides that he could stop it. Leg swelling?: '[]'$  Yes    '[x]'$  No Headaches?: '[]'$  Yes    '[x]'$  No Dizziness? '[]'$  Yes    '[x]'$  No Comments: rides his bike for 1 hr daily has a stationary and regular bike  OSA:  never got appt for sleep study.  Not using his CPAP machine which he had purchased off [employer].  Tried using with mask that cardiologist gave him Dr. Claiborne Billings but too much air leaked.  Had sleep study over 10 yrs ago.  Wife tells him he snores very loud and sometimes stops breathing in his sleep.  No morning headaches, some tiredness during the day  PreDM/Overwgh:  exercises  regularly.  Does well with eating habits.  Drinks mainly water and green tea, no soft drinks  BPH:  stopped Tamulosin. Does not feel he needs it any more.  Use to urinate 3-5x/night; now only 1-2 times.  Urine flows freely.   HM:  Received flu shot 05/2022 at work.  Had Tdapt last yr when he did change of status for citizenship.  Had only 2 COVID shots, declines booster Patient Active Problem List   Diagnosis Date Noted   Bilateral impacted cerumen 08/07/2020   Benign prostatic hyperplasia with lower urinary tract symptoms 08/07/2020   Urge incontinence of urine 08/07/2020   OSA on CPAP 08/07/2020   Overweight (BMI 25.0-29.9) 08/07/2020   Prediabetes 05/27/2019   Essential hypertension 11/02/2018   Gastroesophageal reflux disease without esophagitis 11/02/2018   Coronary artery disease of native artery of native heart with stable angina pectoris ([place]) 11/02/2018   NSTEMI (non-ST elevated myocardial infarction) ([place]) 10/04/2018     Current Outpatient Medications on File Prior to Visit  Medication Sig Dispense Refill   acetaminophen (TYLENOL) 500 MG tablet Take 500-1,000 mg by mouth every 6 (six) hours as needed (for pain or headaches).     aspirin EC 81 MG tablet Take 1 tablet (81 mg total) by mouth daily. 90 tablet 3  calcium carbonate (TUMS - DOSED IN MG ELEMENTAL CALCIUM) 500 MG chewable tablet Chew 1 tablet by mouth daily as needed for indigestion or heartburn.      cholecalciferol (VITAMIN D3) 25 MCG (1000 UNIT) tablet Take 1,000 Units by mouth daily.     losartan (COZAAR) 50 MG tablet TAKE 1 TABLET (50 MG TOTAL) BY MOUTH DAILY. 90 tablet 3   metoprolol tartrate (LOPRESSOR) 25 MG tablet Take 1.5 tablets (37.5 mg total) by mouth 2 (two) times daily. 90 tablet 6   nitroGLYCERIN (NITROSTAT) 0.4 MG SL tablet PLACE 1 TABLET (0.4 MG TOTAL) UNDER THE TONGUE EVERY 5 (FIVE) MINUTES X 3 DOSES AS NEEDED FOR CHEST PAIN. 25 tablet 6   Omega-3 Fatty Acids (FISH OIL PO) Take 1 capsule by mouth  daily.     atorvastatin (LIPITOR) 40 MG tablet TAKE 1 TABLET (40 MG TOTAL) BY MOUTH DAILY. 90 tablet 3   isosorbide mononitrate (IMDUR) 60 MG 24 hr tablet TAKE 1 TABLET (60 MG TOTAL) BY MOUTH DAILY. 90 tablet 2   tamsulosin (FLOMAX) 0.4 MG CAPS capsule Take 1 capsule (0.4 mg total) by mouth daily. (Patient not taking: Reported on 10/22/2022) 30 capsule 3   No current facility-administered medications on file prior to visit.    No Known Allergies  Social History   Socioeconomic History   Marital status: Single    Spouse name: Not on file   Number of children: Not on file   Years of education: Not on file   Highest education level: Not on file  Occupational History   Not on file  Tobacco Use   Smoking status: Never   Smokeless tobacco: Never  Vaping Use   Vaping Use: Never used  Substance and Sexual Activity   Alcohol use: Yes    Comment: 2 beers/month   Drug use: Never   Sexual activity: Yes  Other Topics Concern   Not on file  Social History Narrative   Lives locally with wife.  Does not routinely exercise.  Works in Engineer, civil (consulting).   [place] Strain: Not on file  Food Insecurity: Not on file  Transportation Needs: Not on file  Physical Activity: Not on file  Stress: Not on file  Social Connections: Not on file  Intimate Partner Violence: Not on file    Family History  Problem Relation Age of Onset   Cancer Mother        liver   Heart attack Father        died @ 40    Past Surgical History:  Procedure Laterality Date   CORONARY STENT INTERVENTION N/A 10/05/2018   Procedure: CORONARY STENT INTERVENTION;  Surgeon: Lorretta Harp, MD;  Location: [place] [facility];  Service: Cardiovascular;  Laterality: N/A;  MID LAD   INTRAVASCULAR PRESSURE WIRE/FFR STUDY N/A 10/05/2018   Procedure: INTRAVASCULAR PRESSURE WIRE/FFR STUDY;  Surgeon: Lorretta Harp, MD;  Location: [place] [facility];  Service: Cardiovascular;  Laterality:  N/A;  FFR LAD   LEFT HEART CATH AND CORONARY ANGIOGRAPHY N/A 10/05/2018   Procedure: LEFT HEART CATH AND CORONARY ANGIOGRAPHY;  Surgeon: Lorretta Harp, MD;  Location: [place] [facility];  Service: Cardiovascular;  Laterality: N/A;    ROS: Review of Systems  HENT:  Negative for congestion, hearing loss and sore throat.   Eyes:  Negative for visual disturbance.       Wears glasses.  Last eye exam 03/2022  Respiratory:  Negative for  cough and shortness of breath.   Cardiovascular:  Negative for palpitations and leg swelling.  Gastrointestinal:  Negative for blood in stool.  Endocrine: Negative for cold intolerance.  Genitourinary:  Negative for difficulty urinating.  Musculoskeletal:  Negative for arthralgias.  Skin:  Negative for rash.     PHYSICAL EXAM: BP (!) 163/82 (BP Location: Left Arm, Patient Position: Sitting, Cuff Size: Normal)   Pulse 86   Temp 98.3 F (36.8 C) (Oral)   Ht '5\' 7"'$  (1.702 m)   Wt 170 lb (77.1 kg)   SpO2 99%   BMI 26.63 kg/m   Wt Readings from Last 3 Encounters:  10/22/22 170 lb (77.1 kg)  02/19/22 171 lb 12.8 oz (77.9 kg)  01/15/22 170 lb 9.6 oz (77.4 kg)    Physical Exam   General appearance - alert, well appearing, and in no distress Mental status - normal mood, behavior, speech, dress, motor activity, and thought processes Eyes - pupils equal and reactive, extraocular eye movements intact Ears -moderate amount of hard wax buildup in the right ear canal obscuring view of the eardrum.  Left ear canal and tympanic membrane within normal limits. Nose - normal and patent, no erythema, discharge or polyps Mouth - mucous membranes moist, pharynx normal without lesions Neck - supple, no significant adenopathy Lymphatics - no palpable lymphadenopathy, no hepatosplenomegaly Chest - clear to auscultation, no wheezes, rales or rhonchi, symmetric air entry Heart - normal rate, regular rhythm, normal S1, S2, no murmurs, rubs, clicks or gallops Abdomen -  soft, nontender, nondistended, no masses or organomegaly Neurological - cranial nerves II through XII intact, motor and sensory grossly normal bilaterally Musculoskeletal - no joint tenderness, deformity or swelling Extremities - peripheral pulses normal, no pedal edema, no clubbing or cyanosis 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  No lesions noted on scalp     Latest Ref Rng & Units 10/02/2021    3:19 PM 12/26/2020    5:56 PM 08/07/2020    4:43 PM  CMP  Glucose 70 - 99 mg/dL 89  169  104   BUN 8 - 27 mg/dL '20  17  15   '$ Creatinine 0.76 - 1.27 mg/dL 1.04  1.09  0.75   Sodium 134 - 144 mmol/L 140  133  139   Potassium 3.5 - 5.2 mmol/L 4.6  3.4  4.5   Chloride 96 - 106 mmol/L 101  100  103   CO2 20 - 29 mmol/L '27  28  27   '$ Calcium 8.6 - 10.2 mg/dL 9.7  9.1  9.3   Total Protein 6.0 - 8.5 g/dL 7.4   7.1   Total Bilirubin 0.0 - 1.2 mg/dL 1.5   1.2   Alkaline Phos 44 - 121 IU/L 96   105   AST 0 - 40 IU/L 23   28   ALT 0 - 44 IU/L 31   26    Lipid Panel     Component Value Date/Time   CHOL 123 10/02/2021 1519   TRIG 97 10/02/2021 1519   HDL 56 10/02/2021 1519   CHOLHDL 2.2 10/02/2021 1519   CHOLHDL 3.6 10/05/2018 0252   VLDL 33 10/05/2018 0252   LDLCALC 49 10/02/2021 1519    CBC    Component Value Date/Time   WBC 6.4 10/02/2021 1519   WBC 9.3 12/26/2020 1756   RBC 5.14 10/02/2021 1519   RBC 4.87 12/26/2020 1756   HGB 16.6 10/02/2021 1519   HGB 16.2 12/08/2009 1448  HCT 48.8 10/02/2021 1519   HCT 46.2 12/08/2009 1448   PLT 187 10/02/2021 1519   MCV 95 10/02/2021 1519   MCV 95.3 12/08/2009 1448   MCH 32.3 10/02/2021 1519   MCH 32.4 12/26/2020 1756   MCHC 34.0 10/02/2021 1519   MCHC 35.1 12/26/2020 1756   RDW 12.8 10/02/2021 1519   RDW 13.2 12/08/2009 1448   LYMPHSABS 2.3 04/26/2018 2333   LYMPHSABS 2.2 12/08/2009 1448   MONOABS 0.6 04/26/2018 2333   MONOABS 0.3 12/08/2009 1448   EOSABS 0.1 04/26/2018 2333   EOSABS 0.0 12/08/2009 1448   BASOSABS 0.0 04/26/2018 2333    BASOSABS 0.0 12/08/2009 1448    ASSESSMENT AND PLAN: 1. Annual physical exam   2. Coronary artery disease of native artery of native heart with stable angina pectoris [facility]) Discussed importance of being on statin therapy.  He feels Lipitor causes CP.  He is agreeable to trying a different statin therapy.  We will try him with Crestor.  Will check baseline blood test today including liver panel.  I also recommended restarting isosorbide given that he still gets intermittent chest pains.  He is agreeable to restarting at a lower dose.  Continue metoprolol and aspirin. Should f/u with his cardiologist - CBC - Lipid panel - Comprehensive metabolic panel - rosuvastatin (CRESTOR) 20 MG tablet; Take 1 tablet (20 mg total) by mouth daily.  Dispense: 30 tablet; Refill: 6 - isosorbide mononitrate (IMDUR) 30 MG 24 hr tablet; Take 0.5 tablets (15 mg total) by mouth daily.  Dispense: 45 tablet; Refill: 1  3. Essential hypertension Not at goal but patient has not taken his medicines as yet for today.  Encouraged him to take his medicines every day.  Should be on metoprolol 37.5 mg twice a day, Cozaar 50 mg daily and we restarted isosorbide.  4. OSA (obstructive sleep apnea) We will put him in for sleep study since last one was over 10 yrs ago.  Still has symptoms of sleep apnea - [facility] Sleep Study; Future  5. Benign prostatic hyperplasia without lower urinary tract symptoms Patient does not feel he needs the Flomax any longer.  I have taken it off his medication list.  6. Prostate cancer screening He is agreeable to prostate cancer screening. - PSA  7. Overweight (BMI 25.0-29.9) Patient advised to eliminate sugary drinks from the diet, cut back on portion sizes especially of white carbohydrates, eat more white lean meat like chicken [place] and seafood instead of beef or pork and incorporate fresh fruits and vegetables into the diet daily. Continue regular exercise.  Advised to keep nitroglycerin  with him all the time to use if needed - Hemoglobin A1c  8. Prediabetes See #7 above - Hemoglobin A1c  9. COVID-19 vaccination declined      Patient was given the opportunity to ask questions.  Patient verbalized understanding of the plan and was able to repeat key elements of the plan.   This documentation was completed using Radio producer.  Any transcriptional errors are unintentional.  No orders of the defined types were placed in this encounter.    Requested Prescriptions    No prescriptions requested or ordered in this encounter    No follow-ups on file.  Karle Plumber, MD, FACP

## 2022-10-22 NOTE — Patient Instructions (Signed)
Stop Atorvastatin.   This has been changed to Crestor 20 mg daily. Restart isosorbide but at a lower dose. Continue to monitor blood pressure.  Goal is 130/80 or lower.

## 2022-10-23 ENCOUNTER — Encounter: Payer: Self-pay | Admitting: Internal Medicine

## 2022-10-23 LAB — CBC
Hematocrit: 48.5 % (ref 37.5–51.0)
Hemoglobin: 16.5 g/dL (ref 13.0–17.7)
MCH: 32.6 pg (ref 26.6–33.0)
MCHC: 34 g/dL (ref 31.5–35.7)
MCV: 96 fL (ref 79–97)
Platelets: 175 10*3/uL (ref 150–450)
RBC: 5.06 x10E6/uL (ref 4.14–5.80)
RDW: 12.6 % (ref 11.6–15.4)
WBC: 5.5 10*3/uL (ref 3.4–10.8)

## 2022-10-23 LAB — LIPID PANEL
Chol/HDL Ratio: 4.2 ratio (ref 0.0–5.0)
Cholesterol, Total: 189 mg/dL (ref 100–199)
HDL: 45 mg/dL (ref >39)
LDL Chol Calc (NIH): 96 mg/dL (ref 0–99)
Triglycerides: 284 mg/dL — ABNORMAL HIGH (ref 0–149)
VLDL Cholesterol Cal: 48 mg/dL — ABNORMAL HIGH (ref 5–40)

## 2022-10-23 LAB — COMPREHENSIVE METABOLIC PANEL
ALT: 33 IU/L (ref 0–44)
AST: 22 IU/L (ref 0–40)
Albumin/Globulin Ratio: 1.9 (ref 1.2–2.2)
Albumin: 4.7 g/dL (ref 3.9–4.9)
Alkaline Phosphatase: 93 IU/L (ref 44–121)
BUN/Creatinine Ratio: 23 (ref 10–24)
BUN: 18 mg/dL (ref 8–27)
Bilirubin Total: 1.2 mg/dL (ref 0.0–1.2)
CO2: 23 mmol/L (ref 20–29)
Calcium: 9.5 mg/dL (ref 8.6–10.2)
Chloride: 97 mmol/L (ref 96–106)
Creatinine, Ser: 0.78 mg/dL (ref 0.76–1.27)
Globulin, Total: 2.5 g/dL (ref 1.5–4.5)
Glucose: 105 mg/dL — ABNORMAL HIGH (ref 70–99)
Potassium: 4.3 mmol/L (ref 3.5–5.2)
Sodium: 135 mmol/L (ref 134–144)
Total Protein: 7.2 g/dL (ref 6.0–8.5)
eGFR: 100 mL/min/{1.73_m2} (ref >59)

## 2022-10-23 LAB — HEMOGLOBIN A1C
Est. average glucose Bld gHb Est-mCnc: 128 mg/dL
Hgb A1c MFr Bld: 6.1 % — ABNORMAL HIGH (ref 4.8–5.6)

## 2022-10-23 LAB — PSA: Prostate Specific Ag, Serum: 0.6 ng/mL (ref 0.0–4.0)

## 2022-10-29 ENCOUNTER — Ambulatory Visit: Payer: BC Managed Care – PPO | Admitting: Internal Medicine

## 2022-12-16 NOTE — Progress Notes (Signed)
Cardiology Office Note:    Date:  12/17/2022   ID:  Vere Diantonio, DOB [birth year], MRN [id number]  PCP:  Marcine Matar, MD  Cardiologist:  Parke Poisson, MD  Electrophysiologist:  None   Referring MD: Marcine Matar, MD   Chief Complaint/Reason for Referral: CAD, prior NSTEMI  History of Present Illness:    Andrew Hunter is a 62 y.o. male with a history of NSTEMI s/p PCI to mid LAD and residual ostial first marginal 60-70% stenosis felt best for medical management at time of ACS. PCI on 10/05/2018.  Completed 1 year of DAPT.   At his last appointment he had not yet received his sleep mask. He felt a repeat sleep study may be indicated since his last 1 was greater than 10 years ago to assist with obtaining new equipment that will be covered.  We recommended Itamar sleep study. Intermittently he needed to take nitroglycerin, but he wasn't taking Imdur daily since he felt he didn't need it all the time. We discussed the long-acting effect of Imdur and as needed nitroglycerin and titration of medical therapy for angina.  He would try to take Imdur daily, usually at noon.   On 10/22/2022 he saw his PCP for his annual physical. He felt that Lipitor was causing chest pain. He was amenable to changing his Lipitor to rosuvastatin 20 mg. He was restarted on isosorbide at 15 mg daily.  Today, he is accompanied by a professional interpreter Eddie. He complains of intermittent left chest pain that he describes as a little pain in his left chest. When this occurs, he will drink some water which helps relieve the pain in about 5 minutes. Additionally he has occasional acid reflux that he confirms is distinct from his chest pain. In the past year, he is taking nitroglycerin maybe 1 or 2 times a month. He has been taking Isosorbide maybe once a week. In the morning he sometimes wakes up with a headache, which he would attribute to sleep apnea. He will then take the isosorbide. He doesn't necessarily  need to take isosorbide and nitro on the same days. He confirms that he had developed different chest pains while on atorvastatin previously.  Lately he is staying active with working, going to church, and performing some exercise. After exercising he feels better afterwards.  He is compliant with his 81 mg ASA. He denies any bleeding issues or melena.  During his most recent lab work he states that he had eaten oatmeal with flaxseed. His triglycerides were elevated likely secondary to not fasting. Usually he drinks decaffeinated coffee. After drinking black coffee his blood pressure is higher. He monitors his blood pressure daily in the mornings, and reports readings such as 100/70.  He denies any palpitations, shortness of breath, or peripheral edema. No lightheadedness, syncope, orthopnea, or PND.  Past Medical History:  Diagnosis Date   Coronary artery disease    Essential hypertension     Past Surgical History:  Procedure Laterality Date   CORONARY PRESSURE/FFR STUDY N/A 10/05/2018   Procedure: INTRAVASCULAR PRESSURE WIRE/FFR STUDY;  Surgeon: Runell Gess, MD;  Location: [facility] INVASIVE CV LAB;  Service: Cardiovascular;  Laterality: N/A;  FFR LAD   CORONARY STENT INTERVENTION N/A 10/05/2018   Procedure: CORONARY STENT INTERVENTION;  Surgeon: Runell Gess, MD;  Location: [facility] INVASIVE CV LAB;  Service: Cardiovascular;  Laterality: N/A;  MID LAD   LEFT HEART CATH AND CORONARY ANGIOGRAPHY N/A 10/05/2018   Procedure: LEFT HEART CATH AND  CORONARY ANGIOGRAPHY;  Surgeon: Runell Gess, MD;  Location: [facility] INVASIVE CV LAB;  Service: Cardiovascular;  Laterality: N/A;    Current Medications: Current Meds  Medication Sig   acetaminophen (TYLENOL) 500 MG tablet Take 500-1,000 mg by mouth every 6 (six) hours as needed (for pain or headaches).   aspirin EC 81 MG tablet Take 1 tablet (81 mg total) by mouth daily.   calcium carbonate (TUMS - DOSED IN MG ELEMENTAL CALCIUM) 500 MG chewable  tablet Chew 1 tablet by mouth daily as needed for indigestion or heartburn.    cholecalciferol (VITAMIN D3) 25 MCG (1000 UNIT) tablet Take 1,000 Units by mouth daily.   ezetimibe (ZETIA) 10 MG tablet Take 1 tablet (10 mg total) by mouth daily.   isosorbide mononitrate (IMDUR) 30 MG 24 hr tablet Take 0.5 tablets (15 mg total) by mouth daily.   Omega-3 Fatty Acids (FISH OIL PO) Take 1 capsule by mouth daily.   rosuvastatin (CRESTOR) 20 MG tablet Take 1 tablet (20 mg total) by mouth daily.   [DISCONTINUED] metoprolol tartrate (LOPRESSOR) 25 MG tablet Take 1.5 tablets (37.5 mg total) by mouth 2 (two) times daily.     Allergies:   Atorvastatin   Social History   Tobacco Use   Smoking status: Never   Smokeless tobacco: Never  Vaping Use   Vaping Use: Never used  Substance Use Topics   Alcohol use: Yes    Comment: 2 beers/month   Drug use: Never     Family History: The patient's family history includes Cancer in his mother; Heart attack in his father.  ROS:   Please see the history of present illness.    (+) Left chest pain (+) Acid reflux (+) Occasional headache All other systems reviewed and are negative.  EKGs/Labs/Other Studies Reviewed:    The following studies were reviewed today:  Exercise Stress Myoview  10/29/2019: The left ventricular ejection fraction is hyperdynamic (>65%). Nuclear stress EF: 70%. Blood pressure demonstrated a normal response to exercise. Horizontal ST segment depression ST segment depression was noted during stress in the III and aVF leads, beginning at 7 minutes of stress, and returning to baseline after less than 1 minute of recovery. 1-2 mm ST depressions. Defect 1: There is a small defect of mild severity present in the apical inferior location. Findings consistent with prior myocardial infarction.   Excellent exercise capacity without angina. 10 minutes of exercise, 12 METS.  No ischemia on perfusion images.  Possible prior inferoapical  infarct, mild perfusion defect with preserved wall motion.   Echocardiogram  10/05/2018:  1. The left ventricle has normal systolic function with an ejection  fraction of 60-65%. The cavity size was normal. Left ventricular diastolic  Doppler parameters are consistent with impaired relaxation.   2. The right ventricle has normal systolic function. The cavity was  normal. There is no increase in right ventricular wall thickness.   3. The mitral valve is normal in structure.   4. The tricuspid valve is normal in structure.   5. The aortic valve is tricuspid Aortic valve regurgitation is trivial by  color flow Doppler.   6. The pulmonic valve was normal in structure.   7. Normal LV systolic function; mild diastolic dysfunction.   Left Heart Cath  10/05/2018: Prox RCA lesion is 30% stenosed. Ost 1st Mrg lesion is 60% stenosed. Prox LAD to Mid LAD lesion is 50% stenosed. The left ventricular systolic function is normal. LV end diastolic pressure is normal. The left ventricular  ejection fraction is 55-65% by visual estimate.  IMPRESSION: Successful FFR guided mid LAD PCI and drug-eluting stenting of an "intermediate lesion" with an FFR 0.79.  Patient had excellent angiographic result with a synergy stent and a maximal post dilatation diameter of 2.63 mm.  He does have a 60 to 70% ostial first obtuse marginal branch stenosis.  This is a moderate size vessel which will be treated medically at this time.  He has normal LV function.  He will be treated with dual antiplatelet therapy including aspirin and Brilinta for at least 1 year uninterrupted.  Anticipate that he will be discharged home in the morning.   EKG:   EKG is personally reviewed. 12/17/2022:  Sinus rhythm. Incomplete RBBB. 01/15/2022:  Normal sinus rhythm, nonspecific intraventricular conduction delay, QRS duration 126 ms. Previously noted to have right bundle branch block on last ECG.   Recent Labs: 10/22/2022: ALT 33; BUN 18;  Creatinine, Ser 0.78; Hemoglobin 16.5; Platelets 175; Potassium 4.3; Sodium 135   Recent Lipid Panel    Component Value Date/Time   CHOL 189 10/22/2022 1121   TRIG 284 (H) 10/22/2022 1121   HDL 45 10/22/2022 1121   CHOLHDL 4.2 10/22/2022 1121   CHOLHDL 3.6 10/05/2018 0252   VLDL 33 10/05/2018 0252   LDLCALC 96 10/22/2022 1121    Physical Exam:    VS:  BP 134/86   Pulse 63   SpO2 96%     Wt Readings from Last 5 Encounters:  10/22/22 170 lb (77.1 kg)  02/19/22 171 lb 12.8 oz (77.9 kg)  01/15/22 170 lb 9.6 oz (77.4 kg)  10/20/21 172 lb 9.6 oz (78.3 kg)  10/02/21 170 lb 9.6 oz (77.4 kg)    Constitutional: No acute distress Eyes: sclera non-icteric, normal conjunctiva and lids ENMT: normal dentition, moist mucous membranes Cardiovascular: regular rhythm, normal rate, no murmurs. S1 and S2 normal. Radial pulses normal bilaterally. No jugular venous distention.  Respiratory: clear to auscultation bilaterally GI : normal bowel sounds, soft and nontender. No distention.   MSK: extremities warm, well perfused. No edema.  NEURO: grossly nonfocal exam, moves all extremities. PSYCH: alert and oriented x 3, normal mood and affect.   ASSESSMENT:    1. NSTEMI (non-ST elevated myocardial infarction) [facility]): February 2020, DES to LAD   2. Coronary artery disease of native artery of native heart with stable angina pectoris (HCC)   3. Essential hypertension   4. Hyperlipidemia with target LDL less than 70   5. OSA (obstructive sleep apnea)   6. Chest pain, unspecified type   7. Medication management     PLAN:    NSTEMI (non-ST elevated myocardial infarction) [facility]): February 2020, DES to LAD Coronary artery disease of native artery of native heart with stable angina pectoris (HCC) Chest pain, unspecified type -Chest pain is intermittent, well treated with monthly as needed dose of nitroglycerin and weekly use of Imdur.  Counseled on appropriate use of medications and need for daily  dosing of his daily medications.  - Continue aspirin, atorvastatin, isosorbide, losartan, metoprolol at current doses.    Essential hypertension-stable on current regimen, Continue current doses.  Refills of losartan, metoprolol, nitroglycerin are provided today.  Hyperlipidemia with target LDL less than 70 -Continue rosuvastatin 20 mg daily, transition from atorvastatin due to concern of atypical myalgias in the chest. -LDL is 96, triglycerides likely inaccurate but are elevated -Shared decision making, will add Zetia 10 mg daily.  Lipids and LP(a) recheck in 3 months.  Labs should  be obtained fasting.  OSA (obstructive sleep apnea)-  Patient has a history of OSA.  Encourage compliance with CPAP.  Follow-up:  1 year.  Total time of encounter: 30 minutes total time of encounter, including 20 minutes spent in face-to-face patient care on the date of this encounter. This time includes coordination of care and counseling regarding above mentioned problem list. Remainder of non-face-to-face time involved reviewing chart documents/testing relevant to the patient encounter and documentation in the medical record. I have independently reviewed documentation from referring provider.   Weston Brass, MD, [facility]  [facility]   Medication Adjustments/Labs and Tests Ordered: Current medicines are reviewed at length with the patient today.  Concerns regarding medicines are outlined above.   Orders Placed This Encounter  Procedures   Lipid panel   Lipoprotein A (LPA)   EKG 12-Lead   Meds ordered this encounter  Medications   nitroGLYCERIN (NITROSTAT) 0.4 MG SL tablet    Sig: PLACE 1 TABLET (0.4 MG TOTAL) UNDER THE TONGUE EVERY 5 (FIVE) MINUTES X 3 DOSES AS NEEDED FOR CHEST PAIN.    Dispense:  25 tablet    Refill:  3   losartan (COZAAR) 50 MG tablet    Sig: TAKE 1 TABLET (50 MG TOTAL) BY MOUTH DAILY.    Dispense:  90 tablet    Refill:  3   metoprolol tartrate (LOPRESSOR) 25 MG  tablet    Sig: Take 1.5 tablets (37.5 mg total) by mouth 2 (two) times daily.    Dispense:  90 tablet    Refill:  6   ezetimibe (ZETIA) 10 MG tablet    Sig: Take 1 tablet (10 mg total) by mouth daily.    Dispense:  90 tablet    Refill:  3   Patient Instructions  Medication Instructions:   START: ZETIA  (EZETIMIBE) 10mg  ONCE DAILY   METOPROLOL, NITRO, AND LOSARTAN REFILLED TODAY   *If you need a refill on your cardiac medications before your next appointment, please call your pharmacy*  Lab Work:  Please return for Blood Work in 3 MONTHS . No appointment needed, lab here at the office is open Monday-Friday from 8AM to 4PM and closed daily for lunch from 12:45-1:45.   If you have labs (blood work) drawn today and your tests are completely normal, you will receive your results only by: MyChart Message (if you have MyChart) OR A paper copy in the mail If you have any lab test that is abnormal or we need to change your treatment, we will call you to review the results.  Follow-Up: At [facility], you and your health needs are our priority.  As part of our continuing mission to provide you with exceptional heart care, we have created designated Provider Care Teams.  These Care Teams include your primary Cardiologist (physician) and Advanced Practice Providers (APPs -  Physician Assistants and Nurse Practitioners) who all work together to provide you with the care you need, when you need it.  Your next appointment:   1 year(s)  Provider:   Parke Poisson, MD      [facility],acting as a scribe for Parke Poisson, MD.,have documented all relevant documentation on the behalf of Parke Poisson, MD,as directed by  Parke Poisson, MD while in the presence of Parke Poisson, MD.  I, Parke Poisson, MD, have reviewed all documentation for the visit on 12/17/2022. The documentation on today's date of service for the exam, diagnosis, procedures,  and orders are  all accurate and complete.

## 2022-12-17 ENCOUNTER — Other Ambulatory Visit: Payer: Self-pay

## 2022-12-17 ENCOUNTER — Encounter: Payer: Self-pay | Admitting: Internal Medicine

## 2022-12-17 ENCOUNTER — Ambulatory Visit: Payer: BC Managed Care – PPO | Admitting: Internal Medicine

## 2022-12-17 ENCOUNTER — Ambulatory Visit: Payer: BC Managed Care – PPO | Attending: Internal Medicine | Admitting: Internal Medicine

## 2022-12-17 VITALS — BP 134/86 | HR 63 | Ht 67.0 in | Wt 164.8 lb

## 2022-12-17 DIAGNOSIS — E785 Hyperlipidemia, unspecified: Secondary | ICD-10-CM | POA: Diagnosis not present

## 2022-12-17 DIAGNOSIS — R079 Chest pain, unspecified: Secondary | ICD-10-CM

## 2022-12-17 DIAGNOSIS — I25118 Atherosclerotic heart disease of native coronary artery with other forms of angina pectoris: Secondary | ICD-10-CM

## 2022-12-17 DIAGNOSIS — I214 Non-ST elevation (NSTEMI) myocardial infarction: Secondary | ICD-10-CM | POA: Diagnosis not present

## 2022-12-17 DIAGNOSIS — I10 Essential (primary) hypertension: Secondary | ICD-10-CM

## 2022-12-17 DIAGNOSIS — G4733 Obstructive sleep apnea (adult) (pediatric): Secondary | ICD-10-CM

## 2022-12-17 DIAGNOSIS — Z79899 Other long term (current) drug therapy: Secondary | ICD-10-CM

## 2022-12-17 MED ORDER — LOSARTAN POTASSIUM 50 MG PO TABS
50.0000 mg | ORAL_TABLET | Freq: Every day | ORAL | 3 refills | Status: DC
  Filled 2022-12-17: qty 90, 90d supply, fill #0
  Filled 2023-05-02: qty 30, 30d supply, fill #1
  Filled 2023-07-15: qty 90, 90d supply, fill #1
  Filled 2023-10-20: qty 90, 90d supply, fill #2

## 2022-12-17 MED ORDER — NITROGLYCERIN 0.4 MG SL SUBL
0.4000 mg | SUBLINGUAL_TABLET | SUBLINGUAL | 3 refills | Status: DC | PRN
  Filled 2022-12-17: qty 25, 30d supply, fill #0
  Filled 2023-05-02 – 2023-07-15 (×2): qty 25, 30d supply, fill #1

## 2022-12-17 MED ORDER — EZETIMIBE 10 MG PO TABS
10.0000 mg | ORAL_TABLET | Freq: Every day | ORAL | 3 refills | Status: DC
  Filled 2022-12-17: qty 90, 90d supply, fill #0
  Filled 2023-05-02: qty 30, 30d supply, fill #1

## 2022-12-17 MED ORDER — METOPROLOL TARTRATE 25 MG PO TABS
37.5000 mg | ORAL_TABLET | Freq: Two times a day (BID) | ORAL | 6 refills | Status: DC
Start: 2022-12-17 — End: 2024-01-09
  Filled 2022-12-17: qty 90, 30d supply, fill #0
  Filled 2023-03-11: qty 90, 30d supply, fill #1
  Filled 2023-04-25 – 2023-05-02 (×2): qty 90, 30d supply, fill #2
  Filled 2023-06-09: qty 90, 30d supply, fill #3
  Filled 2023-07-15: qty 90, 30d supply, fill #4
  Filled 2023-08-18: qty 90, 30d supply, fill #5
  Filled 2023-09-21: qty 90, 30d supply, fill #6

## 2022-12-17 NOTE — Patient Instructions (Signed)
Medication Instructions:   START: ZETIA  (EZETIMIBE) 10mg  ONCE DAILY   METOPROLOL, NITRO, AND LOSARTAN REFILLED TODAY   *If you need a refill on your cardiac medications before your next appointment, please call your pharmacy*  Lab Work:  Please return for Blood Work in 3 MONTHS . No appointment needed, lab here at the office is open Monday-Friday from 8AM to 4PM and closed daily for lunch from 12:45-1:45.   If you have labs (blood work) drawn today and your tests are completely normal, you will receive your results only by: MyChart Message (if you have MyChart) OR A paper copy in the mail If you have any lab test that is abnormal or we need to change your treatment, we will call you to review the results.  Follow-Up: At [facility], you and your health needs are our priority.  As part of our continuing mission to provide you with exceptional heart care, we have created designated Provider Care Teams.  These Care Teams include your primary Cardiologist (physician) and Advanced Practice Providers (APPs -  Physician Assistants and Nurse Practitioners) who all work together to provide you with the care you need, when you need it.  Your next appointment:   1 year(s)  Provider:   Parke Poisson, MD

## 2023-01-27 ENCOUNTER — Emergency Department (HOSPITAL_COMMUNITY)
Admission: EM | Admit: 2023-01-27 | Discharge: 2023-01-28 | Disposition: A | Discharge status: Home / Self Care | Payer: BC Managed Care – PPO | Attending: Emergency Medicine | Admitting: Emergency Medicine

## 2023-01-27 DIAGNOSIS — I10 Essential (primary) hypertension: Secondary | ICD-10-CM | POA: Diagnosis not present

## 2023-01-27 DIAGNOSIS — I2510 Atherosclerotic heart disease of native coronary artery without angina pectoris: Secondary | ICD-10-CM | POA: Diagnosis not present

## 2023-01-27 DIAGNOSIS — Z7982 Long term (current) use of aspirin: Secondary | ICD-10-CM | POA: Insufficient documentation

## 2023-01-27 DIAGNOSIS — Z9861 Coronary angioplasty status: Secondary | ICD-10-CM | POA: Diagnosis not present

## 2023-01-27 DIAGNOSIS — Z79899 Other long term (current) drug therapy: Secondary | ICD-10-CM | POA: Insufficient documentation

## 2023-01-27 DIAGNOSIS — R079 Chest pain, unspecified: Secondary | ICD-10-CM | POA: Diagnosis not present

## 2023-01-27 DIAGNOSIS — R072 Precordial pain: Secondary | ICD-10-CM | POA: Diagnosis not present

## 2023-01-28 ENCOUNTER — Emergency Department (HOSPITAL_COMMUNITY): Payer: BC Managed Care – PPO

## 2023-01-28 ENCOUNTER — Other Ambulatory Visit: Payer: Self-pay

## 2023-01-28 ENCOUNTER — Encounter (HOSPITAL_COMMUNITY): Payer: Self-pay | Admitting: Emergency Medicine

## 2023-01-28 DIAGNOSIS — R079 Chest pain, unspecified: Secondary | ICD-10-CM | POA: Diagnosis not present

## 2023-01-28 LAB — BASIC METABOLIC PANEL
Anion gap: 10 (ref 5–15)
BUN: 15 mg/dL (ref 8–23)
CO2: 23 mmol/L (ref 22–32)
Calcium: 9.1 mg/dL (ref 8.9–10.3)
Chloride: 99 mmol/L (ref 98–111)
Creatinine, Ser: 0.81 mg/dL (ref 0.61–1.24)
GFR, Estimated: >60 mL/min (ref >60)
Glucose, Bld: 100 mg/dL — ABNORMAL HIGH (ref 70–99)
Potassium: 3.5 mmol/L (ref 3.5–5.1)
Sodium: 132 mmol/L — ABNORMAL LOW (ref 135–145)

## 2023-01-28 LAB — CBC
HCT: 46 % (ref 39.0–52.0)
Hemoglobin: 16 g/dL (ref 13.0–17.0)
MCH: 31.7 pg (ref 26.0–34.0)
MCHC: 34.8 g/dL (ref 30.0–36.0)
MCV: 91.3 fL (ref 80.0–100.0)
Platelets: 158 10*3/uL (ref 150–400)
RBC: 5.04 MIL/uL (ref 4.22–5.81)
RDW: 12.4 % (ref 11.5–15.5)
WBC: 7.3 10*3/uL (ref 4.0–10.5)
nRBC: 0 % (ref 0.0–0.2)

## 2023-01-28 LAB — TROPONIN I (HIGH SENSITIVITY)
Troponin I (High Sensitivity): 7 ng/L (ref <18)
Troponin I (High Sensitivity): 7 ng/L (ref <18)

## 2023-01-28 MED ORDER — MORPHINE SULFATE (PF) 4 MG/ML IV SOLN
4.0000 mg | Freq: Once | INTRAVENOUS | Status: AC
  Administered 2023-01-28: 4 mg via INTRAVENOUS
  Filled 2023-01-28: qty 1

## 2023-01-28 MED ORDER — IOHEXOL 350 MG/ML SOLN
60.0000 mL | Freq: Once | INTRAVENOUS | Status: AC | PRN
  Administered 2023-01-28: 60 mL via INTRAVENOUS

## 2023-01-28 MED ORDER — OMEPRAZOLE 20 MG PO CPDR
20.0000 mg | DELAYED_RELEASE_CAPSULE | Freq: Every day | ORAL | 0 refills | Status: DC
  Filled 2023-01-28: qty 30, 30d supply, fill #0

## 2023-01-28 MED ORDER — ALUM & MAG HYDROXIDE-SIMETH 200-200-20 MG/5ML PO SUSP
30.0000 mL | Freq: Once | ORAL | Status: AC
  Administered 2023-01-28: 30 mL via ORAL
  Filled 2023-01-28: qty 30

## 2023-01-28 NOTE — ED Triage Notes (Signed)
Pt presents from nonradiating, central CP that makes deep breath difficult that started at rest today. Took 3 nitro with minimal relief from third dose. Had daily 81mg  ASA this AM. Denies blood thinner currently.   Remains 10/10 currently.  H/o cardiac stent. Denies diaphoresis, N/V.

## 2023-01-28 NOTE — ED Provider Notes (Signed)
[name] [facility] DE[facility]MENT AT [facility] Provider Note   CSN: [id number] Arrival date & time: 01/27/23  2356     History  Chief Complaint  Patient presents with   Chest Pain    Andrew Hunter is a 62 y.o. male.  Patient with history of CAD, hypertension, NSTEMI status post PCI in 2020 presents today with complaints of chest pain. He states that same began suddenly yesterday evening around 6 pm after he ate dinner. Pain is substernal in nature and does not radiate. Denies any history of similar symptoms previously. This does not feel like his previous heart attack. He does endorse that his pain is worse with breathing. It is not reproducible to palpation. He took 325 ASA and 3 nitroglycerin. Pain was initially severe and 10/10 in nature. Upon my evaluation after patient waited 7 hours to be seen due to extended wait times, patient states that his pain is still 6/10. He denies nausea or vomiting or diaphoresis. Denies leg pain or leg swelling. No recent travel or recent surgeries. He does not smoke.  The history is provided by the patient. No language interpreter was used.  Chest Pain      Home Medications Prior to Admission medications   Medication Sig Start Date End Date Taking? Authorizing Provider  acetaminophen (TYLENOL) 500 MG tablet Take 500-1,000 mg by mouth every 6 (six) hours as needed (for pain or headaches).    [provider]  aspirin EC 81 MG tablet Take 1 tablet (81 mg total) by mouth daily. 11/02/18   Marcine Matar, MD  calcium carbonate (TUMS - DOSED IN MG ELEMENTAL CALCIUM) 500 MG chewable tablet Chew 1 tablet by mouth daily as needed for indigestion or heartburn.     [provider]  cholecalciferol (VITAMIN D3) 25 MCG (1000 UNIT) tablet Take 1,000 Units by mouth daily.    [provider]  ezetimibe (ZETIA) 10 MG tablet Take 1 tablet (10 mg total) by mouth daily. 12/17/22 03/17/23  Parke Poisson, MD  isosorbide  mononitrate (IMDUR) 30 MG 24 hr tablet Take 0.5 tablets (15 mg total) by mouth daily. 10/22/22   Marcine Matar, MD  losartan (COZAAR) 50 MG tablet Take 1 tablet (50 mg total) by mouth daily. 12/17/22 12/17/23  Parke Poisson, MD  metoprolol tartrate (LOPRESSOR) 25 MG tablet Take 1.5 tablets (37.5 mg total) by mouth 2 (two) times daily. 12/17/22   Parke Poisson, MD  nitroGLYCERIN (NITROSTAT) 0.4 MG SL tablet Place 1 tablet (0.4 mg total) under the tongue every 5 (five) minutes, max 3 doses as needed for chest pain. 12/17/22   Parke Poisson, MD  Omega-3 Fatty Acids (FISH OIL PO) Take 1 capsule by mouth daily.    [provider]  rosuvastatin (CRESTOR) 20 MG tablet Take 1 tablet (20 mg total) by mouth daily. 10/22/22   Marcine Matar, MD      Allergies    Atorvastatin    Review of Systems   Review of Systems  Cardiovascular:  Positive for chest pain.  All other systems reviewed and are negative.   Physical Exam Updated Vital Signs BP (!) 144/96   Pulse 77   Temp 97.9 F (36.6 C) (Oral)   Resp 18   Ht 5\' 7"  (1.702 m)   Wt 75.3 kg   SpO2 100%   BMI 26.00 kg/m  Physical Exam Vitals and nursing note reviewed.  Constitutional:      General: He is not  in acute distress.    Appearance: Normal appearance. He is normal weight. He is not ill-appearing, toxic-appearing or diaphoretic.  HENT:     Head: Normocephalic and atraumatic.  Cardiovascular:     Rate and Rhythm: Normal rate and regular rhythm.     Pulses:          Dorsalis pedis pulses are 2+ on the right side and 2+ on the left side.       Posterior tibial pulses are 2+ on the right side and 2+ on the left side.     Heart sounds: Normal heart sounds.  Pulmonary:     Effort: Pulmonary effort is normal. No respiratory distress.     Breath sounds: Normal breath sounds.  Chest:     Chest wall: No tenderness.  Abdominal:     Palpations: Abdomen is soft.     Tenderness: There is no abdominal tenderness.   Musculoskeletal:        General: Normal range of motion.     Cervical back: Normal range of motion.     Right lower leg: No tenderness. No edema.     Left lower leg: No tenderness. No edema.  Skin:    General: Skin is warm and dry.  Neurological:     General: No focal deficit present.     Mental Status: He is alert.  Psychiatric:        Mood and Affect: Mood normal.        Behavior: Behavior normal.     ED Results / Procedures / Treatments   Labs (all labs ordered are listed, but only abnormal results are displayed) Labs Reviewed  BASIC METABOLIC PANEL - Abnormal; Notable for the following components:      Result Value   Sodium 132 (*)    Glucose, Bld 100 (*)    All other components within normal limits  CBC  TROPONIN I (HIGH SENSITIVITY)  TROPONIN I (HIGH SENSITIVITY)    EKG EKG Interpretation  Date/Time:  Friday January 28 2023 06:27:22 EDT Ventricular Rate:  72 PR Interval:  162 QRS Duration: 118 QT Interval:  403 QTC Calculation: 441 R Axis:   69 Text Interpretation: Sinus rhythm Incomplete right bundle branch block Low voltage, precordial leads Minimal ST elevation, inferior leads Confirmed by Kristine Royal [phone]) on 01/28/2023 8:10:44 AM  Radiology CT Angio Chest PE W and/or Wo Contrast  Result Date: 01/28/2023 CLINICAL DATA:  Chest pain. EXAM: CT ANGIOGRAPHY CHEST WITH CONTRAST TECHNIQUE: Multidetector CT imaging of the chest was performed using the standard protocol during bolus administration of intravenous contrast. Multiplanar CT image reconstructions and MIPs were obtained to evaluate the vascular anatomy. RADIATION DOSE REDUCTION: This exam was performed according to the departmental dose-optimization program which includes automated exposure control, adjustment of the mA and/or kV according to patient size and/or use of iterative reconstruction technique. CONTRAST:  60mL OMNIPAQUE IOHEXOL 350 MG/ML SOLN COMPARISON:  None Available. FINDINGS: Cardiovascular:  Satisfactory opacification of the pulmonary arteries to the segmental level. No evidence of pulmonary embolism. Normal heart size. No pericardial effusion. Mediastinum/Nodes: No enlarged mediastinal, hilar, or axillary lymph nodes. Thyroid gland, trachea, and esophagus demonstrate no significant findings. Lungs/Pleura: Lungs are clear. No pleural effusion or pneumothorax. Upper Abdomen: No acute abnormality. Musculoskeletal: No chest wall abnormality. No acute or significant osseous findings. Review of the MIP images confirms the above findings. IMPRESSION: No definite evidence of pulmonary embolus. No acute abnormality seen in the chest. Electronically Signed   By: Fayrene Fearing  Christen Butter M.D.   On: 01/28/2023 08:38   DG Chest 2 View  Result Date: 01/28/2023 CLINICAL DATA:  Chest pain EXAM: CHEST - 2 VIEW COMPARISON:  12/26/2020 FINDINGS: Heart size and pulmonary vascularity are normal. Vascular crowding in the lung bases. No airspace disease or consolidation. No pleural effusions. No pneumothorax. Mediastinal contours appear intact. No change since prior study. IMPRESSION: No active cardiopulmonary disease. Electronically Signed   By: Burman Nieves M.D.   On: 01/28/2023 00:30    Procedures Procedures    Medications Ordered in ED Medications  morphine (PF) 4 MG/ML injection 4 mg (4 mg Intravenous Given 01/28/23 0709)  iohexol (OMNIPAQUE) 350 MG/ML injection 60 mL (60 mLs Intravenous Contrast Given 01/28/23 0824)  alum & mag hydroxide-simeth (MAALOX/MYLANTA) 200-200-20 MG/5ML suspension 30 mL (30 mLs Oral Given 01/28/23 0859)    ED Course/ Medical Decision Making/ A&P             HEART Score: 3                Medical Decision Making Amount and/or Complexity of Data Reviewed Labs: ordered. Radiology: ordered.  Risk OTC drugs. Prescription drug management.   This patient is a 62 y.o. male who presents to the ED for concern of chest pain, this involves an extensive number of treatment options, and  is a complaint that carries with it a high risk of complications and morbidity. The emergent differential diagnosis prior to evaluation includes, but is not limited to,  ACS, pericarditis, myocarditis, aortic dissection, PE, pneumothorax, esophageal spasm or rupture, chronic angina, pneumonia, bronchitis, GERD, reflux/PUD, biliary disease, pancreatitis, costochondritis, anxiety This is not an exhaustive differential.   Past Medical History / Co-morbidities / Social History: history of CAD, hypertension, NSTEMI status post PCI in 2020  Physical Exam: Physical exam performed. The pertinent findings include: per above, no pertinent physical exam findings  Lab Tests: I ordered, and personally interpreted labs.  The pertinent results include:  Na 132, troponins negative and flat   Imaging Studies: I ordered imaging studies including CXR, CT PE. I independently visualized and interpreted imaging which showed NAD. I agree with the radiologist interpretation.   Cardiac Monitoring:  The patient was maintained on a cardiac monitor.  My attending physician Dr. Rodena Medin viewed and interpreted the cardiac monitored which showed an underlying rhythm of: no STEMI. I agree with this interpretation.   Medications: I ordered medication including morphine, GI cocktail  for pain. Reevaluation of the patient after these medicines showed that the patient resolved. I have reviewed the patients home medicines and have made adjustments as needed.   Disposition: After consideration of the diagnostic results and the patients response to treatment, I feel that emergency department workup does not suggest an emergent condition requiring admission or immediate intervention beyond what has been performed at this time. The plan is: Discharge with close outpatient follow-up.  After medications, patient states his symptoms have completely resolved.  He feels ready to go home.  Given his description of symptoms of pain after  eating dinner and improvement with GI cocktail, suspect this could potentially be acid reflux.  Will send home with Prilosec for management of same. Evaluation and diagnostic testing in the emergency department does not suggest an emergent condition requiring admission or immediate intervention beyond what has been performed at this time.  Plan for discharge with close PCP follow-up.  Patient is understanding and amenable with plan, educated on red flag symptoms that would prompt immediate return.  Patient discharged in stable condition.   Final Clinical Impression(s) / ED Diagnoses Final diagnoses:  Precordial chest pain    Rx / DC Orders ED Discharge Orders          Ordered    omeprazole (PRILOSEC) 20 MG capsule  Daily        01/28/23 0909          An After Visit Summary was printed and given to the patient.     Vear Clock 01/28/23 1610    Wynetta Fines, MD 01/28/23 [phone]

## 2023-01-28 NOTE — Discharge Instructions (Signed)
You were seen in the emergency department today for chest pain.  As we discussed your lab work, EKG, chest x-ray, and CT all looked reassuring today.  I think that your symptoms could likely been related to acid reflux.  Given this, I have written you a prescription for Prilosec which is a acid reducing medication for you to take 30 minutes before the largest meal of the day.  Please call your primary care doctor to schedule a follow-up appointment at your earliest convenience.  I recommend monitoring your stress levels.  Continue to monitor how you are doing overall, and return to the emergency department for any new or worsening symptoms such as: Worsening pain or pain with exertion, difficulty breathing, sweating, or pain or swelling in your legs.

## 2023-02-25 ENCOUNTER — Ambulatory Visit: Payer: BC Managed Care – PPO | Admitting: Internal Medicine

## 2023-05-02 ENCOUNTER — Other Ambulatory Visit: Payer: Self-pay

## 2023-05-10 ENCOUNTER — Other Ambulatory Visit: Payer: Self-pay

## 2023-05-18 ENCOUNTER — Ambulatory Visit: Payer: Self-pay

## 2023-05-18 NOTE — Telephone Encounter (Signed)
Message from Phill Myron sent at 05/18/2023  3:39 PM EDT  Summary: +covid Head pain   Head pain, cough, not sleeping        Called pt and LM on Vm to call back. See contact information for name of interpreter.

## 2023-05-18 NOTE — Telephone Encounter (Signed)
Patient called using Interpreter Ellyn Hack [phone], left VM to return the call to the office to speak to the NT.   Summary: +covid Head pain   Head pain, cough, not sleeping

## 2023-05-18 NOTE — Telephone Encounter (Signed)
3rd attempt, Patient called with interpreter Hernando [phone] left VM to return the call to the office to speak to the NT. Unable to reach patient after 3 attempts by [facility] NT, routing to the provider for resolution per protocol.

## 2023-05-20 NOTE — Telephone Encounter (Signed)
Patient states sx's are better today. He was worse on yesterday with body aches and cough. He denies SOB. He states he is taking Tylenol and NyQuil. He only wishes to take Tylenol at this time for fever and body aches and not Ibuprofen.   Attempted to give a virtual appt for Monday at 1330, patient stated he will be fine, he will be ok not to have the apt for Covid however, he requested an appt to f/u in the office for health conditions. Given apt for 06/10/2023 at 0910.   No interpreter needed, patient able to speak and understand [place].

## 2023-06-10 ENCOUNTER — Other Ambulatory Visit: Payer: Self-pay

## 2023-06-10 ENCOUNTER — Ambulatory Visit: Payer: BC Managed Care – PPO | Admitting: Internal Medicine

## 2023-07-15 ENCOUNTER — Other Ambulatory Visit: Payer: Self-pay

## 2023-08-16 ENCOUNTER — Ambulatory Visit: Payer: BC Managed Care – PPO | Admitting: Internal Medicine

## 2023-08-16 ENCOUNTER — Other Ambulatory Visit: Payer: Self-pay

## 2023-08-16 ENCOUNTER — Ambulatory Visit: Payer: BC Managed Care – PPO | Attending: Internal Medicine | Admitting: Internal Medicine

## 2023-08-16 VITALS — BP 120/76 | HR 69 | Temp 97.8°F | Ht 67.0 in | Wt 163.0 lb

## 2023-08-16 DIAGNOSIS — R7303 Prediabetes: Secondary | ICD-10-CM

## 2023-08-16 DIAGNOSIS — Z23 Encounter for immunization: Secondary | ICD-10-CM | POA: Diagnosis not present

## 2023-08-16 DIAGNOSIS — R0681 Apnea, not elsewhere classified: Secondary | ICD-10-CM

## 2023-08-16 DIAGNOSIS — I25118 Atherosclerotic heart disease of native coronary artery with other forms of angina pectoris: Secondary | ICD-10-CM | POA: Diagnosis not present

## 2023-08-16 DIAGNOSIS — R0683 Snoring: Secondary | ICD-10-CM

## 2023-08-16 DIAGNOSIS — I10 Essential (primary) hypertension: Secondary | ICD-10-CM | POA: Diagnosis not present

## 2023-08-16 LAB — POCT GLYCOSYLATED HEMOGLOBIN (HGB A1C): HbA1c, POC (prediabetic range): 6 % (ref 5.7–6.4)

## 2023-08-16 LAB — GLUCOSE, POCT (MANUAL RESULT ENTRY): POC Glucose: 110 mg/dL — AB (ref 70–99)

## 2023-08-16 MED ORDER — ROSUVASTATIN CALCIUM 10 MG PO TABS
10.0000 mg | ORAL_TABLET | Freq: Every day | ORAL | 1 refills | Status: DC
Start: 2023-08-16 — End: 2023-12-16
  Filled 2023-08-16: qty 90, 90d supply, fill #0
  Filled 2023-11-09: qty 90, 90d supply, fill #1

## 2023-08-16 NOTE — Progress Notes (Signed)
Patient ID: Andrew Hunter, male    DOB: 12-Feb-1961  MRN: [id number]  CC: Hypertension (HTN & prediabetes f/u. Andrew Hunter physical. Andrew Hunter to flu & shingles vax)   Subjective: Andrew Hunter is a 62 y.o. male who presents for chronic ds management. Not due for physical  His concerns today include:  Patient with history of HTN, preDM, CAD with hx of NSTEMI, GERD, OSA CPAP but not using consistently, BPH, urge incont   Discussed the use of AI scribe software for clinical note transcription with the patient, who gave verbal consent to proceed.  History of Present Illness   The patient, with a history of heart disease and prediabetes, presents with daily chest pain that is relieved by drinking water. He reports that the pain disappears after hydration, leading him to believe that his blood 'needs water to run good.' He denies chest pain during exercise, specifically while riding his stationary bike for an hour daily. He has not been taking his prescribed isosorbide daily, only when he feels 'something bad.' He also takes nitroglycerin as needed for chest pain, last used a week ago.  He has been taking losartan 50 mg and metoprolol 25 mg 1.5 tab BID for blood pressure control.  He has been consistent with his cholesterol medication, rosuvastatin. He has not been taking the Zetia prescribed by cardiology.  The patient also reports symptoms suggestive of sleep apnea, including morning headaches and snoring described as 'like a lion' by his wife.  Wife also notices that he stops breathing intermittently in his sleep.  He denies daytime sleepiness or tiredness, but reports feeling 'terrible' if he does not get his usual eight hours of sleep. He has not yet completed a sleep study that was previously ordered.  Reports having had a sleep study done about 10 years ago but never found out the results.  In terms of his prediabetes, he reports trying to limit his intake of sugars and carbohydrates. He exercises  daily, either on his stationary bike or outside when the weather permits. He believes his exercise regimen helps keep his blood sugar levels in check.       Patient Active Problem List   Diagnosis Date Noted   Bilateral impacted cerumen 08/07/2020   Benign prostatic hyperplasia with lower urinary tract symptoms 08/07/2020   Urge incontinence of urine 08/07/2020   OSA on CPAP 08/07/2020   Overweight (BMI 25.0-29.9) 08/07/2020   Prediabetes 05/27/2019   Essential hypertension 11/02/2018   Gastroesophageal reflux disease without esophagitis 11/02/2018   Coronary artery disease of native artery of native heart with stable angina pectoris (HCC) 11/02/2018   NSTEMI (non-ST elevated myocardial infarction) (HCC) 10/04/2018     Current Outpatient Medications on File Prior to Visit  Medication Sig Dispense Refill   acetaminophen (TYLENOL) 500 MG tablet Take 500-1,000 mg by mouth every 6 (six) hours as needed (for pain or headaches).     aspirin EC 81 MG tablet Take 1 tablet (81 mg total) by mouth daily. 90 tablet 3   calcium carbonate (TUMS - DOSED IN MG ELEMENTAL CALCIUM) 500 MG chewable tablet Chew 1 tablet by mouth daily as needed for indigestion or heartburn.      cholecalciferol (VITAMIN D3) 25 MCG (1000 UNIT) tablet Take 1,000 Units by mouth daily.     losartan (COZAAR) 50 MG tablet Take 1 tablet (50 mg total) by mouth daily. 90 tablet 3   metoprolol tartrate (LOPRESSOR) 25 MG tablet Take 1.5 tablets (37.5 mg total) by mouth  2 (two) times daily. 90 tablet 6   nitroGLYCERIN (NITROSTAT) 0.4 MG SL tablet Place 1 tablet (0.4 mg total) under the tongue every 5 (five) minutes, max 3 doses as needed for chest pain. 25 tablet 3   Omega-3 Fatty Acids (FISH OIL PO) Take 1 capsule by mouth daily.     ezetimibe (ZETIA) 10 MG tablet Take 1 tablet (10 mg total) by mouth daily. 90 tablet 3   No current facility-administered medications on file prior to visit.    Allergies  Allergen Reactions    Atorvastatin Other (See Comments)    Chest pains    Social History   Socioeconomic History   Marital status: Married    Spouse name: Not on file   Number of children: Not on file   Years of education: Not on file   Highest education level: Not on file  Occupational History   Not on file  Tobacco Use   Smoking status: Never   Smokeless tobacco: Never  Vaping Use   Vaping status: Never Used  Substance and Sexual Activity   Alcohol use: Yes    Comment: 2 beers/month   Drug use: Never   Sexual activity: Yes  Other Topics Concern   Not on file  Social History Narrative   Lives locally with wife.  Does not routinely exercise.  Works in Therapist, sports.   Social Drivers of Health   Financial Resource Strain: Not on file  Food Insecurity: Not on file  Transportation Needs: Not on file  Physical Activity: Not on file  Stress: Not on file  Social Connections: Not on file  Intimate Partner Violence: Not on file    Family History  Problem Relation Age of Onset   Cancer Mother        liver   Heart attack Father        died @ 65    Past Surgical History:  Procedure Laterality Date   CORONARY PRESSURE/FFR STUDY N/A 10/05/2018   Procedure: INTRAVASCULAR PRESSURE WIRE/FFR STUDY;  Surgeon: Runell Gess, MD;  Location: [facility] INVASIVE [facility] LAB;  Service: Cardiovascular;  Laterality: N/A;  FFR LAD   CORONARY STENT INTERVENTION N/A 10/05/2018   Procedure: CORONARY STENT INTERVENTION;  Surgeon: Runell Gess, MD;  Location: [facility] INVASIVE CV LAB;  Service: Cardiovascular;  Laterality: N/A;  MID LAD   LEFT HEART CATH AND CORONARY ANGIOGRAPHY N/A 10/05/2018   Procedure: LEFT HEART CATH AND CORONARY ANGIOGRAPHY;  Surgeon: Runell Gess, MD;  Location: [facility] INVASIVE CV LAB;  Service: Cardiovascular;  Laterality: N/A;    ROS: Review of Systems Negative except as stated above  PHYSICAL EXAM: BP 120/76 (BP Location: Left Arm, Patient Position: Sitting, Cuff Size: Normal)   Pulse 69    Temp 97.8 F (36.6 C) (Oral)   Ht 5\' 7"  (1.702 m)   Wt 163 lb (73.9 kg)   SpO2 100%   BMI 25.53 kg/m   Physical Exam  General appearance - alert, well appearing, older Hispanic male and in no distress Mental status - normal mood, behavior, speech, dress, motor activity, and thought processes Neck - supple, no significant adenopathy Chest - clear to auscultation, no wheezes, rales or rhonchi, symmetric air entry Heart - normal rate, regular rhythm, normal S1, S2, no murmurs, rubs, clicks or gallops Extremities - peripheral pulses normal, no pedal edema, no clubbing or cyanosis  Results for orders placed or performed in visit on 08/16/23  POCT glucose (manual entry)   Collection Time: 08/16/23  9:55 AM  Result Value Ref Range   POC Glucose 110 (A) 70 - 99 mg/dl  POCT glycosylated hemoglobin (Hb A1C)   Collection Time: 08/16/23 10:04 AM  Result Value Ref Range   Hemoglobin A1C     HbA1c POC (<> result, manual entry)     HbA1c, POC (prediabetic range) 6.0 5.7 - 6.4 %   HbA1c, POC (controlled diabetic range)         Latest Ref Rng & Units 01/28/2023   12:29 AM 10/22/2022   11:21 AM 10/02/2021    3:19 PM  CMP  Glucose 70 - 99 mg/dL 782  956  89   BUN 8 - 23 mg/dL 15  18  20    Creatinine 0.61 - 1.24 mg/dL 2.13  0.86  5.78   Sodium 135 - 145 mmol/L 132  135  140   Potassium 3.5 - 5.1 mmol/L 3.5  4.3  4.6   Chloride 98 - 111 mmol/L 99  97  101   CO2 22 - 32 mmol/L 23  23  27    Calcium 8.9 - 10.3 mg/dL 9.1  9.5  9.7   Total Protein 6.0 - 8.5 g/dL  7.2  7.4   Total Bilirubin 0.0 - 1.2 mg/dL  1.2  1.5   Alkaline Phos 44 - 121 IU/L  93  96   AST 0 - 40 IU/L  22  23   ALT 0 - 44 IU/L  33  31    Lipid Panel     Component Value Date/Time   CHOL 189 10/22/2022 1121   TRIG 284 (H) 10/22/2022 1121   HDL 45 10/22/2022 1121   CHOLHDL 4.2 10/22/2022 1121   CHOLHDL 3.6 10/05/2018 0252   VLDL 33 10/05/2018 0252   LDLCALC 96 10/22/2022 1121    CBC    Component Value Date/Time   WBC  7.3 01/28/2023 0029   RBC 5.04 01/28/2023 0029   HGB 16.0 01/28/2023 0029   HGB 16.5 10/22/2022 1121   HGB 16.2 12/08/2009 1448   HCT 46.0 01/28/2023 0029   HCT 48.5 10/22/2022 1121   HCT 46.2 12/08/2009 1448   PLT 158 01/28/2023 0029   PLT 175 10/22/2022 1121   MCV 91.3 01/28/2023 0029   MCV 96 10/22/2022 1121   MCV 95.3 12/08/2009 1448   MCH 31.7 01/28/2023 0029   MCHC 34.8 01/28/2023 0029   RDW 12.4 01/28/2023 0029   RDW 12.6 10/22/2022 1121   RDW 13.2 12/08/2009 1448   LYMPHSABS 2.3 04/26/2018 2333   LYMPHSABS 2.2 12/08/2009 1448   MONOABS 0.6 04/26/2018 2333   MONOABS 0.3 12/08/2009 1448   EOSABS 0.1 04/26/2018 2333   EOSABS 0.0 12/08/2009 1448   BASOSABS 0.0 04/26/2018 2333   BASOSABS 0.0 12/08/2009 1448    ASSESSMENT AND PLAN: 1. Essential hypertension (Primary) At goal.  Continue metoprolol 37.5 mg twice a day and Cozaar 50 mg daily.  2. Coronary artery disease of native artery of native heart with stable angina pectoris (HCC) Stable.  Reports some chest pains at times that goes away with drinking water.  However no chest pains with exertion like riding his stationary bike for an hour daily.  He has not been taking the isosorbide consistently so we will have him discontinue that.  Continue aspirin, metoprolol, rosuvastatin and Zetia - rosuvastatin (CRESTOR) 10 MG tablet; Take 1 tablet (10 mg total) by mouth daily.  Dispense: 90 tablet; Refill: 1 - Hepatic Function Panel  3. Prediabetes Encouraged him to  continue healthy eating habits and regular exercise. - POCT glycosylated hemoglobin (Hb A1C) - POCT glucose (manual entry)   4. Loud snoring Symptoms suggestive of sleep apnea.  Had referred for sleep study on last visit earlier this year but did not have it done.  He would like for me to resubmit the referral. - [facility] Sleep Study; Future  5. Witnessed episode of apnea See #4 above - [facility] Sleep Study; Future  6. Encounter for immunization - Flu vaccine  trivalent PF, 6mos and older(Flulaval,Afluria,Fluarix,Fluzone) - Varicella-zoster vaccine IM     Patient was given the opportunity to ask questions.  Patient verbalized understanding of the plan and was able to repeat key elements of the plan.   This documentation was completed using Paediatric nurse.  Any transcriptional errors are unintentional.  Orders Placed This Encounter  Procedures   Flu vaccine trivalent PF, 6mos and older(Flulaval,Afluria,Fluarix,Fluzone)   Varicella-zoster vaccine IM   Hepatic Function Panel   POCT glycosylated hemoglobin (Hb A1C)   POCT glucose (manual entry)   PSG Sleep Study     Requested Prescriptions   Signed Prescriptions Disp Refills   rosuvastatin (CRESTOR) 10 MG tablet 90 tablet 1    Sig: Take 1 tablet (10 mg total) by mouth daily.    Return in about 4 months (around 12/15/2023).  Jonah Blue, MD, FACP

## 2023-08-19 ENCOUNTER — Other Ambulatory Visit: Payer: Self-pay

## 2023-09-22 ENCOUNTER — Other Ambulatory Visit: Payer: Self-pay

## 2023-09-28 ENCOUNTER — Telehealth: Payer: Self-pay

## 2023-09-28 DIAGNOSIS — R0681 Apnea, not elsewhere classified: Secondary | ICD-10-CM

## 2023-09-28 DIAGNOSIS — R0683 Snoring: Secondary | ICD-10-CM

## 2023-09-28 NOTE — Telephone Encounter (Signed)
 Called & spoke to the patient. Verified name & DOB. Informed that insurance denied the in-lab sleep study but has approved the home sleep study. Informed patient to wait 2 - 3 weeks before getting a call from [facility] lab sleep with further instructions on how to proceed.  [facility] Sleep [facility] aware.

## 2023-09-28 NOTE — Telephone Encounter (Signed)
 Order place for home sleep test. Please let Andrew Hunter at [facility] sleep lab know.  Also let pt know that his insurance decline the in lab sleep study but will cover a home sleep study.

## 2023-09-28 NOTE — Telephone Encounter (Signed)
 Copied from CRM [id number]. Topic: General - Other >> Sep 28, 2023 11:53 AM Powell HERO wrote: Reason for CRM: Sleep lab calling to inform that patients denied the in lab sleep study but will accept an at home sleep study. If that is okay for the patient they just need a new order placed in epic. [place], [phone].

## 2023-09-28 NOTE — Addendum Note (Signed)
 Addended by: Concetta Dee B on: 09/28/2023 04:08 PM   Modules accepted: Orders

## 2023-10-16 ENCOUNTER — Telehealth: Payer: Self-pay | Admitting: Internal Medicine

## 2023-10-16 NOTE — Telephone Encounter (Signed)
 Home sleep study was approved.  Please call [facility] sleep lab to see if they have touched base with pt as yet.

## 2023-10-24 ENCOUNTER — Other Ambulatory Visit: Payer: Self-pay

## 2023-10-28 ENCOUNTER — Other Ambulatory Visit: Payer: Self-pay

## 2023-11-11 ENCOUNTER — Other Ambulatory Visit: Payer: Self-pay

## 2023-12-05 ENCOUNTER — Other Ambulatory Visit: Payer: Self-pay | Admitting: Internal Medicine

## 2023-12-05 DIAGNOSIS — I25118 Atherosclerotic heart disease of native coronary artery with other forms of angina pectoris: Secondary | ICD-10-CM

## 2023-12-06 NOTE — Telephone Encounter (Signed)
 Requested Prescriptions  Refused Prescriptions Disp Refills   rosuvastatin (CRESTOR) 10 MG tablet 90 tablet 1    Sig: Take 1 tablet (10 mg total) by mouth daily.     Cardiovascular:  Antilipid - Statins 2 Failed - 12/06/2023  4:08 PM      Failed - Lipid Panel in normal range within the last 12 months    Cholesterol, Total  Date Value Ref Range Status  10/22/2022 189 100 - 199 mg/dL Final   LDL Chol Calc (NIH)  Date Value Ref Range Status  10/22/2022 96 0 - 99 mg/dL Final   HDL  Date Value Ref Range Status  10/22/2022 45 >39 mg/dL Final   Triglycerides  Date Value Ref Range Status  10/22/2022 284 (H) 0 - 149 mg/dL Final         Passed - Cr in normal range and within 360 days    Creatinine, Ser  Date Value Ref Range Status  01/28/2023 0.81 0.61 - 1.24 mg/dL Final         Passed - Patient is not pregnant      Passed - Valid encounter within last 12 months    Recent Outpatient Visits           3 months ago Essential hypertension   [facility] [place]. [facility] Lawrance Presume, MD   1 year ago Annual physical exam   [facility] - [facility] Dept Of [place]. [facility] Lawrance Presume, MD   1 year ago Essential hypertension   [facility] [place] [facility] Of [place]. [facility] Lawrance Presume, MD   2 years ago Annual physical exam   [facility] [facility] Of [place]. [facility] Concetta Dee B, MD   2 years ago Left lower quadrant abdominal pain   [facility] Dept Of [place]. [facility] Lawrance Presume, MD       Future Appointments             In 1 week Lawrance Presume, MD [facility] [place] - A Dept Of [place]. [facility]   In 1 month Chancy Comber, Gordy Lauber, MD [facility] [address]

## 2023-12-07 ENCOUNTER — Other Ambulatory Visit: Payer: Self-pay

## 2023-12-07 ENCOUNTER — Other Ambulatory Visit: Payer: Self-pay | Admitting: Internal Medicine

## 2023-12-07 DIAGNOSIS — I10 Essential (primary) hypertension: Secondary | ICD-10-CM

## 2023-12-16 ENCOUNTER — Ambulatory Visit: Payer: BC Managed Care – PPO | Attending: Internal Medicine | Admitting: Internal Medicine

## 2023-12-16 ENCOUNTER — Other Ambulatory Visit: Payer: Self-pay

## 2023-12-16 ENCOUNTER — Encounter: Payer: Self-pay | Admitting: Internal Medicine

## 2023-12-16 VITALS — BP 137/75 | HR 70 | Temp 97.7°F | Ht 67.0 in | Wt 155.0 lb

## 2023-12-16 DIAGNOSIS — Z23 Encounter for immunization: Secondary | ICD-10-CM

## 2023-12-16 DIAGNOSIS — R351 Nocturia: Secondary | ICD-10-CM | POA: Diagnosis not present

## 2023-12-16 DIAGNOSIS — N401 Enlarged prostate with lower urinary tract symptoms: Secondary | ICD-10-CM

## 2023-12-16 DIAGNOSIS — I25118 Atherosclerotic heart disease of native coronary artery with other forms of angina pectoris: Secondary | ICD-10-CM

## 2023-12-16 DIAGNOSIS — Z0000 Encounter for general adult medical examination without abnormal findings: Secondary | ICD-10-CM | POA: Diagnosis not present

## 2023-12-16 DIAGNOSIS — R7303 Prediabetes: Secondary | ICD-10-CM

## 2023-12-16 DIAGNOSIS — Z1211 Encounter for screening for malignant neoplasm of colon: Secondary | ICD-10-CM

## 2023-12-16 DIAGNOSIS — R0681 Apnea, not elsewhere classified: Secondary | ICD-10-CM

## 2023-12-16 DIAGNOSIS — J019 Acute sinusitis, unspecified: Secondary | ICD-10-CM

## 2023-12-16 DIAGNOSIS — I10 Essential (primary) hypertension: Secondary | ICD-10-CM | POA: Diagnosis not present

## 2023-12-16 DIAGNOSIS — Z125 Encounter for screening for malignant neoplasm of prostate: Secondary | ICD-10-CM

## 2023-12-16 DIAGNOSIS — R0683 Snoring: Secondary | ICD-10-CM

## 2023-12-16 DIAGNOSIS — H6122 Impacted cerumen, left ear: Secondary | ICD-10-CM

## 2023-12-16 MED ORDER — AMOXICILLIN-POT CLAVULANATE 875-125 MG PO TABS
1.0000 | ORAL_TABLET | Freq: Two times a day (BID) | ORAL | 0 refills | Status: DC
Start: 2023-12-16 — End: 2024-05-18
  Filled 2023-12-16: qty 20, 10d supply, fill #0

## 2023-12-16 MED ORDER — TAMSULOSIN HCL 0.4 MG PO CAPS
0.4000 mg | ORAL_CAPSULE | Freq: Every day | ORAL | 1 refills | Status: DC
Start: 2023-12-16 — End: 2024-05-18
  Filled 2023-12-16: qty 90, 90d supply, fill #0
  Filled 2024-03-06: qty 90, 90d supply, fill #1

## 2023-12-16 MED ORDER — ROSUVASTATIN CALCIUM 10 MG PO TABS
10.0000 mg | ORAL_TABLET | Freq: Every day | ORAL | 1 refills | Status: DC
  Filled 2023-12-16: qty 90, 90d supply, fill #0

## 2023-12-16 NOTE — Patient Instructions (Signed)
 VISIT SUMMARY:  During your visit, we addressed your nasal congestion, urinary symptoms, and reviewed your chronic conditions including heart disease, prediabetes, and potential sleep apnea. We also discussed general health maintenance and lifestyle recommendations.  YOUR PLAN:  -ACUTE SINUSITIS: You have an infection in your sinuses causing congestion, pain, and brown mucus. We have prescribed antibiotics to treat this. If your symptoms do not improve after completing the antibiotics, please call us  for a possible referral to an ENT specialist.  -BENIGN PROSTATIC HYPERPLASIA WITH URINARY SYMPTOMS: This condition involves an enlarged prostate causing frequent and urgent urination, especially at night. We have stopped the saw palmetto and prescribed Flomax  to help with your symptoms. Please take it once daily in the evening. We will also check your PSA levels with a blood test. Try to reduce fluid intake and avoid caffeinated drinks in the evening.  -HYPERTENSION: Your high blood pressure is well-managed with your current medications, losartan  and metoprolol . No changes are needed at this time.  -HYPERLIPIDEMIA: Your high cholesterol is being managed with rosuvastatin . Continue taking your medication as prescribed.  -PREDIABETES: You have prediabetes, which means your blood sugar levels are higher than normal but not high enough to be classified as diabetes. Your recent dietary changes and exercise are helping. We will recheck your diabetes status with blood tests.  -SLEEP APNEA: You have symptoms of sleep apnea, which is a condition where breathing repeatedly stops and starts during sleep. We will resubmit the request for a sleep study to your insurance. Once approved, please contact [facility] to schedule the study.  -WELLNESS VISIT: We discussed your overall health, including diet and exercise. Please eat at least three servings of fresh fruits daily, limit beef and pork, and choose  lean meats like chicken, [place], and seafood. Aim for 150 minutes of moderate-intensity exercise per week, such as brisk walking or cycling. Also, wear sunscreen when outdoors for more than 10 minutes.  INSTRUCTIONS:  Please complete the course of antibiotics for your sinusitis and call us  if symptoms persist. Take Flomax  as prescribed and reduce fluid intake in the evening. Schedule your sleep study with [facility] once insurance approves it. Continue with your current medications for heart disease and hyperlipidemia, and maintain your healthy lifestyle changes for prediabetes. We will follow up with blood tests for your PSA levels and diabetes status.

## 2023-12-16 NOTE — Progress Notes (Signed)
 Patient ID: Andrew Hunter, male    DOB: [birth year]  MRN: [id number]  CC: Annual Exam (Physical./Intermittent congestion on R nostril, radiating pain to R eye /Yes to Tdap & pneumonia vax)   Subjective: Andrew Hunter is a 63 y.o. male who presents for physical His concerns today include:  Patient with history of HTN, preDM, CAD with hx of NSTEMI, GERD, OSA CPAP but not using consistently, BPH, urge incont   Discussed the use of AI scribe software for clinical note transcription with the patient, who gave verbal consent to proceed.  History of Present Illness   C/o of right-sided nasal congestion, which has been ongoing for two to three weeks. He reports associated pain over the right eye and pressure over the right cheek. He has noticed discolored, brown mucus from the right nostril, sometimes with traces of blood, particularly when he attempts to clear his nose by picking with his 5th finger.   BPH:  reports dark urine and frequent urination at night, sometimes with urgency. Has to go at least 2-3 times during the night.  No dysuria, does not have to strain to get his urine flowing.Andrew Hunter  He  mentions that he has been taking saw palmetto for his prostate.  Regarding his chronic conditions, he is currently on Cozaar  50 mg daily, Metoprolol  25 mg 1.5 tabs BID, and Rosuvastatin  for HTN/CAD.  Denies any chest pains, shortness of breath, lower extremity edema.  Diabetes: He has made dietary changes and exercises regularly to manage his prediabetes.  He walks about 2 times a week for 30 minutes to 1 hour.  He has a bike that he plans to start riding as well since the weather is nice.  On last visit 4 months ago, he reported symptoms suggestive of sleep apnea including morning headaches and snoring described as "like a lion" by his wife.  He endorsed that his wife also told him that he stops breathing intermittently in his sleep.  We referred him for sleep study and got approved for home sleep study  from his insurance.  He had until April 4 to get this done in the sleep lab reached out to him but he did not call back.  He would like for me to reorder the test.  Reports he is still having the same symptoms that have been going on for several years.     Patient Active Problem List   Diagnosis Date Noted   Bilateral impacted cerumen 08/07/2020   Benign prostatic hyperplasia with lower urinary tract symptoms 08/07/2020   Urge incontinence of urine 08/07/2020   OSA on CPAP 08/07/2020   Overweight (BMI 25.0-29.9) 08/07/2020   Prediabetes 05/27/2019   Essential hypertension 11/02/2018   Gastroesophageal reflux disease without esophagitis 11/02/2018   Coronary artery disease of native artery of native heart with stable angina pectoris (HCC) 11/02/2018   NSTEMI (non-ST elevated myocardial infarction) (HCC) 10/04/2018     Current Outpatient Medications on File Prior to Visit  Medication Sig Dispense Refill   acetaminophen  (TYLENOL ) 500 MG tablet Take 500-1,000 mg by mouth every 6 (six) hours as needed (for pain or headaches).     aspirin  EC 81 MG tablet Take 1 tablet (81 mg total) by mouth daily. 90 tablet 3   calcium  carbonate (TUMS - DOSED IN MG ELEMENTAL CALCIUM ) 500 MG chewable tablet Chew 1 tablet by mouth daily as needed for indigestion or heartburn.      cholecalciferol (VITAMIN D3) 25 MCG (1000 UNIT) tablet Take 1,000  Units by mouth daily.     losartan  (COZAAR ) 50 MG tablet Take 1 tablet (50 mg total) by mouth daily. 90 tablet 3   metoprolol  tartrate (LOPRESSOR ) 25 MG tablet Take 1.5 tablets (37.5 mg total) by mouth 2 (two) times daily. 90 tablet 6   nitroGLYCERIN  (NITROSTAT ) 0.4 MG SL tablet Place 1 tablet (0.4 mg total) under the tongue every 5 (five) minutes, max 3 doses as needed for chest pain. 25 tablet 3   No current facility-administered medications on file prior to visit.    Allergies  Allergen Reactions   Atorvastatin  Other (See Comments)    Chest pains    Social  History   Socioeconomic History   Marital status: Married    Spouse name: Not on file   Number of children: Not on file   Years of education: Not on file   Highest education level: GED or equivalent  Occupational History   Not on file  Tobacco Use   Smoking status: Never   Smokeless tobacco: Never  Vaping Use   Vaping status: Never Used  Substance and Sexual Activity   Alcohol use: Yes    Comment: 2 beers/month   Drug use: Never   Sexual activity: Yes  Other Topics Concern   Not on file  Social History Narrative   Lives locally with wife.  Does not routinely exercise.  Works in Therapist, sports.   Social Drivers of Health   Financial Resource Strain: Low Risk  (12/16/2023)   Overall Financial Resource Strain (CARDIA)    Difficulty of Paying Living Expenses: Not very hard  Food Insecurity: No Food Insecurity (12/16/2023)   Hunger Vital Sign    Worried About Running Out of Food in the Last Year: Never true    Ran Out of Food in the Last Year: Never true  Recent Concern: Food Insecurity - Food Insecurity Present (12/15/2023)   Hunger Vital Sign    Worried About Running Out of Food in the Last Year: Sometimes true    Ran Out of Food in the Last Year: Sometimes true  Transportation Needs: No Transportation Needs (12/16/2023)   PRAPARE - Administrator, Civil Service (Medical): No    Lack of Transportation (Non-Medical): No  Physical Activity: Insufficiently Active (12/16/2023)   Exercise Vital Sign    Days of Exercise per Week: 2 days    Minutes of Exercise per Session: 60 min  Stress: No Stress Concern Present (12/16/2023)   [employer] of Occupational Health - Occupational Stress Questionnaire    Feeling of Stress : Not at all  Social Connections: Moderately Integrated (12/16/2023)   Social Connection and Isolation Panel [NHANES]    Frequency of Communication with Friends and Family: Once a week    Frequency of Social Gatherings with Friends and Family: Once a  week    Attends Religious Services: More than 4 times per year    Active Member of [employer] or Organizations: Yes    Attends Engineer, structural: More than 4 times per year    Marital Status: Married  Catering manager Violence: Not At Risk (12/16/2023)   Humiliation, Afraid, Rape, and Kick questionnaire    Fear of Current or Ex-Partner: No    Emotionally Abused: No    Physically Abused: No    Sexually Abused: No    Family History  Problem Relation Age of Onset   Cancer Mother        liver   Heart attack Father  died @ 46    Past Surgical History:  Procedure Laterality Date   CORONARY PRESSURE/FFR STUDY N/A 10/05/2018   Procedure: INTRAVASCULAR PRESSURE WIRE/FFR STUDY;  Surgeon: Avanell Leigh, MD;  Location: [facility];  Service: Cardiovascular;  Laterality: N/A;  FFR LAD   CORONARY STENT INTERVENTION N/A 10/05/2018   Procedure: CORONARY STENT INTERVENTION;  Surgeon: Avanell Leigh, MD;  Location: [facility];  Service: Cardiovascular;  Laterality: N/A;  MID LAD   LEFT HEART CATH AND CORONARY ANGIOGRAPHY N/A 10/05/2018   Procedure: LEFT HEART CATH AND CORONARY ANGIOGRAPHY;  Surgeon: Avanell Leigh, MD;  Location: [facility];  Service: Cardiovascular;  Laterality: N/A;    ROS: Review of Systems  HENT:  Negative for dental problem, hearing loss and trouble swallowing.   Eyes:        Wears glasses.  Last eye exam was last mth.  Has new glasses.  Respiratory:  Negative for cough and shortness of breath.   Cardiovascular:  Negative for chest pain and leg swelling.  Gastrointestinal:  Negative for abdominal pain and blood in stool.  Genitourinary:  Negative for difficulty urinating and hematuria.       Taking Saw Palmetto BID to help prostate. Passing urine okay.  Urinates 2-3x at nights. No straining. Has incontinence at times due to urge.  No dysuria. Drinks Caramel tea at bedtime.  Drinks 1 bottle of water at nights.    Psychiatric/Behavioral:  Negative for dysphoric mood. The patient is not nervous/anxious.    Negative except as stated above  PHYSICAL EXAM: BP 137/75 (BP Location: Left Arm, Patient Position: Sitting, Cuff Size: Normal)   Pulse 70   Temp 97.7 F (36.5 C) (Oral)   Ht 5\' 7"  (1.702 m)   Wt 155 lb (70.3 kg)   SpO2 100%   BMI 24.28 kg/m   Physical Exam General appearance - alert, well appearing, older Hispanic male and in no distress Mental status - normal mood, behavior, speech, dress, motor activity, and thought processes Eyes - pupils equal and reactive, extraocular eye movements intact Ears -mild amount of wax buildup in the left ear obscuring full view of the canal and tympanic membrane.  Right ear canal and membrane within normal limits Nose -mild enlargement of nasal turbinate on right side. Mouth - mucous membranes moist, pharynx normal without lesions Neck - supple, no significant adenopathy Lymphatics - no palpable lymphadenopathy, no hepatosplenomegaly Chest - clear to auscultation, no wheezes, rales or rhonchi, symmetric air entry Heart - normal rate, regular rhythm, normal S1, S2, no murmurs, rubs, clicks or gallops Abdomen - soft, nontender, nondistended, no masses or organomegaly Rectal -CMA Clarissa present: No external lesions.  Prostate felt mildly enlarged. Neurological - cranial nerves II through XII intact, motor and sensory grossly normal bilaterally.  Gait is normal. Musculoskeletal - no joint tenderness, deformity or swelling Extremities - peripheral pulses normal, no pedal edema, no clubbing or cyanosis Skin - scattered mild hyperpigmented patches on distal forearms dorsally     Latest Ref Rng & Units 01/28/2023   12:29 AM 10/22/2022   11:21 AM 10/02/2021    3:19 PM  CMP  Glucose 70 - 99 mg/dL 474  259  89   BUN 8 - 23 mg/dL 15  18  20    Creatinine 0.61 - 1.24 mg/dL 5.63  8.75  6.43   Sodium 135 - 145 mmol/L 132  135  140   Potassium 3.5 - 5.1 mmol/L 3.5  4.3  4.6   Chloride 98 - 111 mmol/L 99  97  101   CO2 22 - 32 mmol/L 23  23  27    Calcium  8.9 - 10.3 mg/dL 9.1  9.5  9.7   Total Protein 6.0 - 8.5 g/dL  7.2  7.4   Total Bilirubin 0.0 - 1.2 mg/dL  1.2  1.5   Alkaline Phos 44 - 121 IU/L  93  96   AST 0 - 40 IU/L  22  23   ALT 0 - 44 IU/L  33  31    Lipid Panel     Component Value Date/Time   CHOL 189 10/22/2022 1121   TRIG 284 (H) 10/22/2022 1121   HDL 45 10/22/2022 1121   CHOLHDL 4.2 10/22/2022 1121   CHOLHDL 3.6 10/05/2018 0252   VLDL 33 10/05/2018 0252   LDLCALC 96 10/22/2022 1121    CBC    Component Value Date/Time   WBC 7.3 01/28/2023 0029   RBC 5.04 01/28/2023 0029   HGB 16.0 01/28/2023 0029   HGB 16.5 10/22/2022 1121   HGB 16.2 12/08/2009 1448   HCT 46.0 01/28/2023 0029   HCT 48.5 10/22/2022 1121   HCT 46.2 12/08/2009 1448   PLT 158 01/28/2023 0029   PLT 175 10/22/2022 1121   MCV 91.3 01/28/2023 0029   MCV 96 10/22/2022 1121   MCV 95.3 12/08/2009 1448   MCH 31.7 01/28/2023 0029   MCHC 34.8 01/28/2023 0029   RDW 12.4 01/28/2023 0029   RDW 12.6 10/22/2022 1121   RDW 13.2 12/08/2009 1448   LYMPHSABS 2.3 04/26/2018 2333   LYMPHSABS 2.2 12/08/2009 1448   MONOABS 0.6 04/26/2018 2333   MONOABS 0.3 12/08/2009 1448   EOSABS 0.1 04/26/2018 2333   EOSABS 0.0 12/08/2009 1448   BASOSABS 0.0 04/26/2018 2333   BASOSABS 0.0 12/08/2009 1448    ASSESSMENT AND PLAN: 1. Annual physical exam (Primary)   2. Acute non-recurrent sinusitis, unspecified location Will treat him for possible right-sided maxillary sinusitis with Augmentin .  Advised that if symptoms do not resolve by completion of antibiotics or he continues to see the brown mucus, he should call to let me know so that we can refer to ENT - amoxicillin -clavulanate (AUGMENTIN ) 875-125 MG tablet; Take 1 tablet by mouth 2 (two) times daily.  Dispense: 20 tablet; Refill: 0  3. Coronary artery disease of native artery of native heart with stable angina pectoris  (HCC) Stable.  Continue Crestor , ASA and metoprolol  - rosuvastatin  (CRESTOR ) 10 MG tablet; Take 1 tablet (10 mg total) by mouth daily.  Dispense: 90 tablet; Refill: 1 - CBC - Comprehensive metabolic panel with GFR - Lipid panel  4. Essential hypertension Not at goal but had not taken medicines as yet for the morning.  He will continue Cozaar  50 mg daily, metoprolol  25 mg 1/2 tablet twice a day.  Continue low-salt diet. - CBC - Comprehensive metabolic panel with GFR  5. Prediabetes Commended him on dietary changes and encouraged him to keep up the good works.  Continue regular exercise with goal of getting in about 150 minutes/week total of moderate intensity exercise - Hemoglobin A1c  6. Loud snoring Patient with history of morning headaches, loud snoring and witnessed apnea episodes.  We will resubmit referral for sleep study.  Advised patient that it takes effort on our part to get prior approval from his insurance so if it is approved again, he needs to move forward with getting the study done he agrees to  do so. - Home sleep test; Future  7. Witnessed episode of apnea - Home sleep test; Future  8. Impacted cerumen of left ear Recommend purchasing wax softener over-the-counter and using it as needed.  9. BPH associated with nocturia Patient reports some urge incontinence.  I think this is associated with his BPH.  Advised to avoid drinking excessive fluid at bedtime including caffeinated beverages. Will give a trial of Flomax . - PSA - tamsulosin  (FLOMAX ) 0.4 MG CAPS capsule; Take 1 capsule (0.4 mg total) by mouth daily.  Dispense: 90 capsule; Refill: 1  10. Need for vaccination against Streptococcus pneumoniae - Pneumococcal conjugate vaccine 20-valent  11. Need for Tdap vaccination Given today.  12. Screening for colon cancer Discussed colon cancer screening.  He prefers to do Cologuard testing again which she has done in the past. - Cologuard  13. Prostate cancer  screening - PSA    Patient was given the opportunity to ask questions.  Patient verbalized understanding of the plan and was able to repeat key elements of the plan.   This documentation was completed using Paediatric nurse.  Any transcriptional errors are unintentional.  Orders Placed This Encounter  Procedures   Pneumococcal conjugate vaccine 20-valent   Tdap vaccine greater than or equal to 7yo IM   CBC   Comprehensive metabolic panel with GFR   Lipid panel   PSA   Hemoglobin A1c   Cologuard   Home sleep test     Requested Prescriptions   Signed Prescriptions Disp Refills   rosuvastatin  (CRESTOR ) 10 MG tablet 90 tablet 1    Sig: Take 1 tablet (10 mg total) by mouth daily.   tamsulosin  (FLOMAX ) 0.4 MG CAPS capsule 90 capsule 1    Sig: Take 1 capsule (0.4 mg total) by mouth daily.   amoxicillin -clavulanate (AUGMENTIN ) 875-125 MG tablet 20 tablet 0    Sig: Take 1 tablet by mouth 2 (two) times daily.    Return in about 4 months (around 04/16/2024).  Concetta Dee, MD, FACP

## 2023-12-17 LAB — COMPREHENSIVE METABOLIC PANEL WITH GFR
ALT: 22 IU/L (ref 0–44)
AST: 22 IU/L (ref 0–40)
Albumin: 4.5 g/dL (ref 3.9–4.9)
Alkaline Phosphatase: 112 IU/L (ref 44–121)
BUN/Creatinine Ratio: 15 (ref 10–24)
BUN: 12 mg/dL (ref 8–27)
Bilirubin Total: 1.1 mg/dL (ref 0.0–1.2)
CO2: 23 mmol/L (ref 20–29)
Calcium: 9.7 mg/dL (ref 8.6–10.2)
Chloride: 95 mmol/L — ABNORMAL LOW (ref 96–106)
Creatinine, Ser: 0.78 mg/dL (ref 0.76–1.27)
Globulin, Total: 3.2 g/dL (ref 1.5–4.5)
Glucose: 111 mg/dL — ABNORMAL HIGH (ref 70–99)
Potassium: 4.5 mmol/L (ref 3.5–5.2)
Sodium: 135 mmol/L (ref 134–144)
Total Protein: 7.7 g/dL (ref 6.0–8.5)
eGFR: 100 mL/min/{1.73_m2} (ref >59)

## 2023-12-17 LAB — CBC
Hematocrit: 45.9 % (ref 37.5–51.0)
Hemoglobin: 15.3 g/dL (ref 13.0–17.7)
MCH: 32.1 pg (ref 26.6–33.0)
MCHC: 33.3 g/dL (ref 31.5–35.7)
MCV: 96 fL (ref 79–97)
Platelets: 245 10*3/uL (ref 150–450)
RBC: 4.76 x10E6/uL (ref 4.14–5.80)
RDW: 12.1 % (ref 11.6–15.4)
WBC: 5.5 10*3/uL (ref 3.4–10.8)

## 2023-12-17 LAB — LIPID PANEL
Chol/HDL Ratio: 2.4 ratio (ref 0.0–5.0)
Cholesterol, Total: 121 mg/dL (ref 100–199)
HDL: 50 mg/dL (ref >39)
LDL Chol Calc (NIH): 56 mg/dL (ref 0–99)
Triglycerides: 74 mg/dL (ref 0–149)
VLDL Cholesterol Cal: 15 mg/dL (ref 5–40)

## 2023-12-17 LAB — HEMOGLOBIN A1C
Est. average glucose Bld gHb Est-mCnc: 143 mg/dL
Hgb A1c MFr Bld: 6.6 % — ABNORMAL HIGH (ref 4.8–5.6)

## 2023-12-17 LAB — PSA: Prostate Specific Ag, Serum: 0.8 ng/mL (ref 0.0–4.0)

## 2023-12-17 NOTE — Progress Notes (Signed)
 Blood test shows he now has diabetes.  He should continue healthy eating habits and regular exercise as discussed on recent visit.  Let me know if he would like to see a nutritionist.  Recommend starting a low-dose of the medication called metformin 500 mg once a day.  Let me know whether he is willing to start the medication and prescription will be sent to his pharmacy. Kidney and liver function test normal. Blood cell counts are normal. Cholesterol levels are normal. PSA, screening test for prostate cancer, is within normal range.

## 2023-12-26 DIAGNOSIS — Z1211 Encounter for screening for malignant neoplasm of colon: Secondary | ICD-10-CM | POA: Diagnosis not present

## 2024-01-03 LAB — COLOGUARD: COLOGUARD: NEGATIVE

## 2024-01-04 ENCOUNTER — Ambulatory Visit: Payer: Self-pay | Admitting: Internal Medicine

## 2024-01-09 ENCOUNTER — Ambulatory Visit: Attending: Internal Medicine | Admitting: Internal Medicine

## 2024-01-09 ENCOUNTER — Telehealth: Payer: Self-pay | Admitting: *Deleted

## 2024-01-09 ENCOUNTER — Encounter: Payer: Self-pay | Admitting: Internal Medicine

## 2024-01-09 ENCOUNTER — Other Ambulatory Visit (HOSPITAL_COMMUNITY): Payer: Self-pay

## 2024-01-09 VITALS — BP 124/64 | HR 74 | Ht 67.0 in | Wt 162.0 lb

## 2024-01-09 DIAGNOSIS — E785 Hyperlipidemia, unspecified: Secondary | ICD-10-CM

## 2024-01-09 DIAGNOSIS — I25118 Atherosclerotic heart disease of native coronary artery with other forms of angina pectoris: Secondary | ICD-10-CM | POA: Diagnosis not present

## 2024-01-09 DIAGNOSIS — R0683 Snoring: Secondary | ICD-10-CM

## 2024-01-09 DIAGNOSIS — G4733 Obstructive sleep apnea (adult) (pediatric): Secondary | ICD-10-CM

## 2024-01-09 DIAGNOSIS — I10 Essential (primary) hypertension: Secondary | ICD-10-CM | POA: Diagnosis not present

## 2024-01-09 DIAGNOSIS — I4510 Unspecified right bundle-branch block: Secondary | ICD-10-CM

## 2024-01-09 DIAGNOSIS — R400 Somnolence: Secondary | ICD-10-CM

## 2024-01-09 DIAGNOSIS — I214 Non-ST elevation (NSTEMI) myocardial infarction: Secondary | ICD-10-CM | POA: Diagnosis not present

## 2024-01-09 MED ORDER — METOPROLOL TARTRATE 25 MG PO TABS
37.5000 mg | ORAL_TABLET | Freq: Two times a day (BID) | ORAL | 6 refills | Status: AC
  Filled 2024-01-09: qty 90, 30d supply, fill #0
  Filled 2024-02-03: qty 90, 30d supply, fill #1
  Filled 2024-03-06: qty 90, 30d supply, fill #2
  Filled 2024-04-10: qty 90, 30d supply, fill #3
  Filled 2024-05-31: qty 90, 30d supply, fill #4
  Filled 2024-06-26: qty 90, 30d supply, fill #5
  Filled 2024-07-26: qty 90, 30d supply, fill #6

## 2024-01-09 MED ORDER — LOSARTAN POTASSIUM 50 MG PO TABS
50.0000 mg | ORAL_TABLET | Freq: Every day | ORAL | 3 refills | Status: AC
Start: 2024-01-09 — End: 2025-01-08
  Filled 2024-01-09: qty 90, 90d supply, fill #0
  Filled 2024-04-16: qty 90, 90d supply, fill #1
  Filled 2024-07-15: qty 90, 90d supply, fill #2

## 2024-01-09 MED ORDER — ROSUVASTATIN CALCIUM 10 MG PO TABS
10.0000 mg | ORAL_TABLET | Freq: Every day | ORAL | 3 refills | Status: AC
  Filled 2024-01-09 – 2024-02-03 (×2): qty 90, 90d supply, fill #0
  Filled 2024-05-07: qty 90, 90d supply, fill #1
  Filled 2024-08-17: qty 90, 90d supply, fill #2

## 2024-01-09 NOTE — Progress Notes (Unsigned)
  Cardiology Office Note:  .   Date:  01/09/2024  ID:  Kolyn Rozario, DOB 1960-12-09, MRN [id number] PCP: Lawrance Presume, MD  [facility] Providers Cardiologist:  Euell Herrlich, MD    History of Present Illness: .   Andrew Hunter is a 63 y.o. male.  Discussed the use of AI scribe software for clinical note transcription with the patient, who gave verbal consent to proceed.  History of Present Illness     ROS: negative except per HPI above.  Studies Reviewed: .        Results  Risk Assessment/Calculations:   {Does this patient have ATRIAL FIBRILLATION?:[phone]}   Physical Exam:   VS:  BP 124/64 (BP Location: Left Arm, Patient Position: Sitting)   Pulse 74   Ht 5\' 7"  (1.702 m)   Wt 162 lb (73.5 kg)   SpO2 96%   BMI 25.37 kg/m    Wt Readings from Last 3 Encounters:  01/09/24 162 lb (73.5 kg)  12/16/23 155 lb (70.3 kg)  08/16/23 163 lb (73.9 kg)     Physical Exam    ASSESSMENT AND PLAN: .    Assessment and Plan Assessment & Plan       Grady Lawman, MD, FACC

## 2024-01-09 NOTE — Progress Notes (Signed)
 Cardiology Office Note:  .   Date:  01/09/2024  ID:  Andrew Hunter, DOB [birth year], MRN [id number] PCP: Lawrance Presume, MD  [facility] Providers Cardiologist:  Euell Herrlich, MD    History of Present Illness: .   Andrew Hunter is a 63 y.o. male.  Discussed the use of AI scribe software for clinical note transcription with the patient, who gave verbal consent to proceed.  History of Present Illness Andrew Hunter is a 63 year old male with coronary artery disease and sleep apnea who presents for a routine cardiovascular checkup and to discuss sleep apnea management.  He has coronary artery disease and experienced a myocardial infarction in 2020, treated with a stent. His current medications include baby aspirin , metoprolol , losartan , and rosuvastatin . He experiences occasional chest pain, using nitroglycerin  about once a month, with the most recent use last week. Drinking water sometimes alleviates the pain. He experiences no dizziness, fainting, or lightheadedness. He is actively managing his health by riding his bicycle to work, contributing to weight loss.    ROS: negative except per HPI above.  Studies Reviewed: [name]    EKG Interpretation Date/Time:  Monday Jan 09 2024 14:33:37 EDT Ventricular Rate:  74 PR Interval:  152 QRS Duration:  136 QT Interval:  412 QTC Calculation: 457 R Axis:   98  Text Interpretation: Normal sinus rhythm Right bundle branch block When compared with ECG of 28-Jan-2023 06:27, qrs duration has increased, RBBB has been seen before. Confirmed by Rondia Higginbotham ([id number]) on 01/09/2024 2:49:00 PM    Results   Risk Assessment/Calculations:   {Does this patient have ATRIAL FIBRILLATION?:[phone]}   Physical Exam:   VS:  BP 124/64 (BP Location: Left Arm, Patient Position: Sitting)   Pulse 74   Ht 5\' 7"  (1.702 m)   Wt 162 lb (73.5 kg)   SpO2 96%   BMI 25.37 kg/m    Wt Readings from Last 3 Encounters:  01/09/24 162 lb (73.5 kg)   12/16/23 155 lb (70.3 kg)  08/16/23 163 lb (73.9 kg)     Physical Exam MEASUREMENTS: Weight- 155. GENERAL: Alert, cooperative, well developed, no acute distress. HEENT: Normocephalic, normal oropharynx, moist mucous membranes. CHEST: Clear to auscultation bilaterally, no wheezes, rhonchi, or crackles. CARDIOVASCULAR: Normal heart rate and rhythm, S1 and S2 normal without murmurs. ABDOMEN: Soft, non-tender, non-distended, without organomegaly, normal bowel sounds. EXTREMITIES: No cyanosis or edema. NEUROLOGICAL: Cranial nerves grossly intact, moves all extremities without gross motor or sensory deficit.   ASSESSMENT AND PLAN: .    Assessment and Plan Assessment & Plan Coronary artery disease with history of myocardial infarction History of myocardial infarction in 2020 with stent placement. Intermittent chest pain managed with nitroglycerin . Moderate blockage noted previously, not requiring intervention. Increased chest pain frequency may indicate worsening blockage. - Continue daily aspirin  81 mg. - Refill metoprolol . - Refill losartan . - Refill rosuvastatin . - Advise to report if nitroglycerin  use increases to weekly. - Consider stress test if chest pain frequency increases.  Right bundle branch block Chronic right bundle branch block with slight worsening on current EKG compared to last year. No syncope, dizziness, or lightheadedness. No immediate concerns without symptoms. - Continue annual EKG monitoring.  Hyperlipidemia Hyperlipidemia well-controlled with rosuvastatin . LDL levels excellent at 56 mg/dL. Rosuvastatin  preferred due to previous chest pain with atorvastatin . - Continue rosuvastatin  10 mg daily.  Hypertension Hypertension well-controlled with current medication regimen. Blood pressure readings are very good. - Continue current antihypertensive regimen.  Sleep apnea Sleep apnea  suspected due to morning headaches and daytime sleepiness. Previous home sleep  test order expired without completion. He prefers home sleep test as initial evaluation. - Order home sleep test.      Grady Lawman, MD, FACC

## 2024-01-09 NOTE — Patient Instructions (Signed)
 Medication Instructions:  No Changes *If you need a refill on your cardiac medications before your next appointment, please call your pharmacy*  Lab Work: None  Testing/Procedures: WatchPAT?  Is a FDA cleared portable home sleep study test that uses a watch and 3 points of contact to monitor 7 different channels, including your heart rate, oxygen saturations, body position, snoring, and chest motion.  The study is easy to use from the comfort of your own home and accurately detect sleep apnea.  Before bed, you attach the chest sensor, attached the sleep apnea bracelet to your nondominant hand, and attach the finger probe.  After the study, the raw data is downloaded from the watch and scored for apnea events.   For more information: [email or web address]  Patient Testing Instructions:  Do not put battery into the device until bedtime when you are ready to begin the test. Please call the support number if you need assistance after following the instructions below: 24 hour support line- [phone] or [facility] support at [phone] (option 2)  Download the IntelWatchPAT One" app through the google play store or App Store  Be sure to turn on or enable access to bluetooth in settlings on your smartphone/ device  Make sure no other bluetooth devices are on and within the vicinity of your smartphone/ device and WatchPAT watch during testing.  Make sure to leave your smart phone/ device plugged in and charging all night.  When ready for bed:  Follow the instructions step by step in the WatchPAT One App to activate the testing device. For additional instructions, including video instruction, visit the WatchPAT One video on Youtube. You can search for WatchPat One within Youtube (video is 4 minutes and 18 seconds) or enter: https://youtube/[email or web address]?[email or web address]_vbiwxE Please note: You will be prompted to enter a Pin to connect via bluetooth when starting the test. The PIN will be assigned  to you when you receive the test.  The device is disposable, but it recommended that you retain the device until you receive a call letting you know the study has been received and the results have been interpreted.  We will let you know if the study did not transmit to us  properly after the test is completed. You do not need to call us  to confirm the receipt of the test.  Please complete the test within 48 hours of receiving PIN.   Frequently Asked Questions:  What is Watch Deatra Face one?  A single use fully disposable home sleep apnea testing device and will not need to be returned after completion.  What are the requirements to use WatchPAT one?  The be able to have a successful watchpat one sleep study, you should have your Watch pat one device, your smart phone, watch pat one app, your PIN number and Internet access What type of phone do I need?  You should have a smart phone that uses Android 5.1 and above or any Iphone with IOS 10 and above How can I download the WatchPAT one app?  Based on your device type search for WatchPAT one app either in google play for android devices or APP store for Iphone's Where will I get my PIN for the study?  Your PIN will be provided by your physician's office. It is used for authentication and if you lose/forget your PIN, please reach out to your providers office.  I do not have Internet at home. Can I do WatchPAT one study?  WatchPAT One needs Internet connection throughout the night to  be able to transmit the sleep data. You can use your home/local internet or your cellular's data package. However, it is always recommended to use home/local Internet. It is estimated that between 20MB-30MB will be used with each study.However, the application will be looking for space in the phone to start the study.  What happens if I lose internet or bluetooth connection?  During the internet disconnection, your phone will not be able to transmit the sleep data. All the  data, will be stored in your phone. As soon as the internet connection is back on, the phone will being sending the sleep data. During the bluetooth disconnection, WatchPAT one will not be able to to send the sleep data to your phone. Data will be kept in the WatchPAT one until two devices have bluetooth connection back on. As soon as the connection is back on, WatchPAT one will send the sleep data to the phone.  How long do I need to wear the WatchPAT one?  After you start the study, you should wear the device at least 6 hours.  How far should I keep my phone from the device?  During the night, your phone should be within 15 feet.  What happens if I leave the room for restroom or other reasons?  Leaving the room for any reason will not cause any problem. As soon as your get back to the room, both devices will reconnect and will continue to send the sleep data. Can I use my phone during the sleep study?  Yes, you can use your phone as usual during the study. But it is recommended to put your watchpat one on when you are ready to go to bed.  How will I get my study results?  A soon as you completed your study, your sleep data will be sent to the provider. They will then share the results with you when they are ready.      Follow-Up: At [facility], you and your health needs are our priority.  As part of our continuing mission to provide you with exceptional heart care, our providers are all part of one team.  This team includes your primary Cardiologist (physician) and Advanced Practice Providers or APPs (Physician Assistants and Nurse Practitioners) who all work together to provide you with the care you need, when you need it.  Your next appointment:   1 year(s)  Provider:   Gayatri A Acharya, MD

## 2024-01-09 NOTE — Telephone Encounter (Signed)
 DR. Chancy Comber ORDERED ITAMAR STUDY TODAY.   Patient agreement reviewed and signed on 01/09/2024.  WatchPAT issued to patient on 01/09/2024 by Alec Huntington, CMA. Patient aware to not open the WatchPAT box until contacted with the activation PIN. Patient profile initialized in CloudPAT on 01/09/2024 by Alec Huntington, CMA. Device serial number: [id number]  Please list Reason for Call as Advice Only and type "WatchPAT issued to patient" in the comment box.

## 2024-02-03 ENCOUNTER — Other Ambulatory Visit: Payer: Self-pay

## 2024-02-03 ENCOUNTER — Other Ambulatory Visit (HOSPITAL_COMMUNITY): Payer: Self-pay

## 2024-02-03 ENCOUNTER — Other Ambulatory Visit: Payer: Self-pay | Admitting: Internal Medicine

## 2024-02-03 DIAGNOSIS — I25118 Atherosclerotic heart disease of native coronary artery with other forms of angina pectoris: Secondary | ICD-10-CM

## 2024-02-06 NOTE — Telephone Encounter (Addendum)
 Prior Authorization for [facility] sent to [facility] via Phone. Reference #[id number] NO PA REQ- [id number]    Ordering provider: DR Chancy Comber Associated diagnoses: R06.83- R40.0 WatchPAT PA obtained on 02/06/2024 by Gaylene Kays, CMA. Authorization: No; tracking ID  [id number] Patient notified of PIN (1234) on 02/06/2024 via Notification Method: phone.

## 2024-02-10 ENCOUNTER — Other Ambulatory Visit (HOSPITAL_COMMUNITY): Payer: Self-pay

## 2024-03-06 ENCOUNTER — Other Ambulatory Visit: Payer: Self-pay | Admitting: Internal Medicine

## 2024-03-06 DIAGNOSIS — I25118 Atherosclerotic heart disease of native coronary artery with other forms of angina pectoris: Secondary | ICD-10-CM

## 2024-03-07 ENCOUNTER — Other Ambulatory Visit: Payer: Self-pay

## 2024-03-09 ENCOUNTER — Other Ambulatory Visit: Payer: Self-pay

## 2024-04-13 ENCOUNTER — Other Ambulatory Visit: Payer: Self-pay

## 2024-04-17 ENCOUNTER — Other Ambulatory Visit (HOSPITAL_COMMUNITY): Payer: Self-pay

## 2024-04-17 ENCOUNTER — Other Ambulatory Visit: Payer: Self-pay

## 2024-04-17 ENCOUNTER — Encounter: Payer: Self-pay | Admitting: Pharmacist

## 2024-04-20 ENCOUNTER — Ambulatory Visit: Admitting: Internal Medicine

## 2024-04-24 NOTE — Telephone Encounter (Signed)
 LVM and sent [facility] msg. 04/24/24

## 2024-05-11 ENCOUNTER — Other Ambulatory Visit: Payer: Self-pay

## 2024-05-17 ENCOUNTER — Telehealth: Payer: Self-pay | Admitting: Internal Medicine

## 2024-05-17 NOTE — Telephone Encounter (Signed)
 Confirmed appt for 9/26

## 2024-05-18 ENCOUNTER — Ambulatory Visit: Attending: Internal Medicine | Admitting: Internal Medicine

## 2024-05-18 ENCOUNTER — Other Ambulatory Visit: Payer: Self-pay

## 2024-05-18 VITALS — BP 103/63 | HR 70 | Temp 98.3°F | Ht 67.0 in | Wt 158.0 lb

## 2024-05-18 DIAGNOSIS — G4733 Obstructive sleep apnea (adult) (pediatric): Secondary | ICD-10-CM

## 2024-05-18 DIAGNOSIS — Z8679 Personal history of other diseases of the circulatory system: Secondary | ICD-10-CM

## 2024-05-18 DIAGNOSIS — Z23 Encounter for immunization: Secondary | ICD-10-CM

## 2024-05-18 DIAGNOSIS — Z79899 Other long term (current) drug therapy: Secondary | ICD-10-CM

## 2024-05-18 DIAGNOSIS — E1159 Type 2 diabetes mellitus with other circulatory complications: Secondary | ICD-10-CM | POA: Diagnosis not present

## 2024-05-18 DIAGNOSIS — I2510 Atherosclerotic heart disease of native coronary artery without angina pectoris: Secondary | ICD-10-CM

## 2024-05-18 DIAGNOSIS — R351 Nocturia: Secondary | ICD-10-CM

## 2024-05-18 DIAGNOSIS — N401 Enlarged prostate with lower urinary tract symptoms: Secondary | ICD-10-CM

## 2024-05-18 DIAGNOSIS — E119 Type 2 diabetes mellitus without complications: Secondary | ICD-10-CM

## 2024-05-18 DIAGNOSIS — I152 Hypertension secondary to endocrine disorders: Secondary | ICD-10-CM

## 2024-05-18 MED ORDER — TAMSULOSIN HCL 0.4 MG PO CAPS
0.4000 mg | ORAL_CAPSULE | Freq: Every day | ORAL | 1 refills | Status: DC
  Filled 2024-05-18 – 2024-05-31 (×2): qty 90, 90d supply, fill #0
  Filled 2024-09-05: qty 90, 90d supply, fill #1

## 2024-05-18 NOTE — Patient Instructions (Addendum)
 [facility] Phone: [phone]

## 2024-05-18 NOTE — Progress Notes (Signed)
 Patient ID: Andrew Hunter, male    DOB: 10-23-1960  MRN: [id number]  CC: Hypertension (HTN f/u. Med refill. /No questions / concerns/Yes to flu vax/)   Subjective: Andrew Hunter is a 63 y.o. male who presents for chronic ds management. His concerns today include:  Patient with history of HTN, preDM, CAD with hx of NSTEMI, GERD, OSA CPAP but not using consistently, BPH, urge incont   Discussed the use of AI scribe software for clinical note transcription with the patient, who gave verbal consent to proceed.  History of Present Illness Andrew Hunter is a 63 year old male with hypertension, coronary artery disease, and diabetes who presents for follow-up of his chronic medical conditions.  HTN/CAD: should be on Cozaar  50 mg daily, rosuvastatin  10 mg daily, aspirin  daily, and metoprolol  25 mg, 1.5 tablets twice a day but only taking once a day at bedtime. He experiences slight chest pain that subsides after drinking water. He does not check his blood pressure at home but feels it might have been high last week. No chest pain on exertion or shortness of breath.  DM: A1C 5.8  his last A1c was 6.6, but recent finger stick results show it has decreased to 5.8. He has been riding his bicycle to work daily, which takes about 15 minutes each way, five days a week. He has also been limiting his intake of carbohydrates, such as tortillas and rice, and has lost four pounds since May, now weighing 158 pounds. He previously declined starting metformin, preferring to manage his condition with diet and exercise.  BPH: He takes Flomax  for prostate enlargement and reports urinating three times a night, with a need to hurry to the bathroom. He drinks a bottle of water at 8 PM and goes to bed at 10 PM. His urine flow is described as 'freely and fast.' His last PSA level in April was 0.8, which is normal.  Home sleep study ordered on last visit with me which he did not do. Saw cardiologist since then and it was  ordered again. He received a home sleep study kit but has not used it as yet because he is having problems setting up an app on his phone that is needed prior to using the equipment.    Patient Active Problem List   Diagnosis Date Noted   Bilateral impacted cerumen 08/07/2020   Benign prostatic hyperplasia with lower urinary tract symptoms 08/07/2020   Urge incontinence of urine 08/07/2020   OSA on CPAP 08/07/2020   Overweight (BMI 25.0-29.9) 08/07/2020   Prediabetes 05/27/2019   Essential hypertension 11/02/2018   Gastroesophageal reflux disease without esophagitis 11/02/2018   Coronary artery disease of native artery of native heart with stable angina pectoris 11/02/2018   NSTEMI (non-ST elevated myocardial infarction) (HCC) 10/04/2018     Current Outpatient Medications on File Prior to Visit  Medication Sig Dispense Refill   acetaminophen  (TYLENOL ) 500 MG tablet Take 500-1,000 mg by mouth every 6 (six) hours as needed (for pain or headaches).     aspirin  EC 81 MG tablet Take 1 tablet (81 mg total) by mouth daily. 90 tablet 3   calcium  carbonate (TUMS - DOSED IN MG ELEMENTAL CALCIUM ) 500 MG chewable tablet Chew 1 tablet by mouth daily as needed for indigestion or heartburn.      cholecalciferol (VITAMIN D3) 25 MCG (1000 UNIT) tablet Take 1,000 Units by mouth daily.     losartan  (COZAAR ) 50 MG tablet Take 1 tablet (50 mg total)  by mouth daily. 90 tablet 3   metoprolol  tartrate (LOPRESSOR ) 25 MG tablet Take 1.5 tablets (37.5 mg total) by mouth 2 (two) times daily. 90 tablet 6   nitroGLYCERIN  (NITROSTAT ) 0.4 MG SL tablet Place 1 tablet (0.4 mg total) under the tongue every 5 (five) minutes, max 3 doses as needed for chest pain. 25 tablet 3   rosuvastatin  (CRESTOR ) 10 MG tablet Take 1 tablet (10 mg total) by mouth daily. 90 tablet 3   tamsulosin  (FLOMAX ) 0.4 MG CAPS capsule Take 1 capsule (0.4 mg total) by mouth daily. 90 capsule 1   amoxicillin -clavulanate (AUGMENTIN ) 875-125 MG tablet  Take 1 tablet by mouth 2 (two) times daily. (Patient not taking: Reported on 01/09/2024) 20 tablet 0   No current facility-administered medications on file prior to visit.    Allergies  Allergen Reactions   Atorvastatin  Other (See Comments)    Chest pains    Social History   Socioeconomic History   Marital status: Married    Spouse name: Not on file   Number of children: Not on file   Years of education: Not on file   Highest education level: GED or equivalent  Occupational History   Not on file  Tobacco Use   Smoking status: Never   Smokeless tobacco: Never  Vaping Use   Vaping status: Never Used  Substance and Sexual Activity   Alcohol use: Yes    Comment: 2 beers/month   Drug use: Never   Sexual activity: Yes  Other Topics Concern   Not on file  Social History Narrative   Lives locally with wife.  Does not routinely exercise.  Works in Therapist, sports.   Social Drivers of Health   Financial Resource Strain: Low Risk  (05/17/2024)   Overall Financial Resource Strain (CARDIA)    Difficulty of Paying Living Expenses: Not very hard  Food Insecurity: No Food Insecurity (05/17/2024)   Hunger Vital Sign    Worried About Running Out of Food in the Last Year: Never true    Ran Out of Food in the Last Year: Never true  Transportation Needs: No Transportation Needs (05/17/2024)   PRAPARE - Administrator, Civil Service (Medical): No    Lack of Transportation (Non-Medical): No  Physical Activity: Sufficiently Active (05/17/2024)   Exercise Vital Sign    Days of Exercise per Week: 6 days    Minutes of Exercise per Session: 30 min  Stress: No Stress Concern Present (05/17/2024)   [employer] of Occupational Health - Occupational Stress Questionnaire    Feeling of Stress: Not at all  Social Connections: Socially Integrated (05/17/2024)   Social Connection and Isolation Panel    Frequency of Communication with Friends and Family: Once a week    Frequency of Social  Gatherings with Friends and Family: More than three times a week    Attends Religious Services: More than 4 times per year    Active Member of [employer] or Organizations: Yes    Attends Engineer, structural: More than 4 times per year    Marital Status: Married  Catering manager Violence: Not At Risk (12/16/2023)   Humiliation, Afraid, Rape, and Kick questionnaire    Fear of Current or Ex-Partner: No    Emotionally Abused: No    Physically Abused: No    Sexually Abused: No    Family History  Problem Relation Age of Onset   Cancer Mother        liver   Heart  attack Father        died @ 47    Past Surgical History:  Procedure Laterality Date   CORONARY PRESSURE/FFR STUDY N/A 10/05/2018   Procedure: INTRAVASCULAR PRESSURE WIRE/FFR STUDY;  Surgeon: Court Dorn PARAS, MD;  Location: [facility];  Service: Cardiovascular;  Laterality: N/A;  FFR LAD   CORONARY STENT INTERVENTION N/A 10/05/2018   Procedure: CORONARY STENT INTERVENTION;  Surgeon: Court Dorn PARAS, MD;  Location: [facility];  Service: Cardiovascular;  Laterality: N/A;  MID LAD   LEFT HEART CATH AND CORONARY ANGIOGRAPHY N/A 10/05/2018   Procedure: LEFT HEART CATH AND CORONARY ANGIOGRAPHY;  Surgeon: Court Dorn PARAS, MD;  Location: [facility];  Service: Cardiovascular;  Laterality: N/A;    ROS: Review of Systems Negative except as stated above  PHYSICAL EXAM: BP 103/63 (BP Location: Left Arm, Patient Position: Sitting, Cuff Size: Normal)   Pulse 70   Temp 98.3 F (36.8 C) (Oral)   Ht 5' 7 (1.702 m)   Wt 158 lb (71.7 kg)   SpO2 99%   BMI 24.75 kg/m   Wt Readings from Last 3 Encounters:  05/18/24 158 lb (71.7 kg)  01/09/24 162 lb (73.5 kg)  12/16/23 155 lb (70.3 kg)    Physical Exam General appearance - alert, well appearing, older Hispanic male and in no distress Mental status - normal mood, behavior, speech, dress, motor activity, and thought processes Chest - clear to  auscultation, no wheezes, rales or rhonchi, symmetric air entry Heart - normal rate, regular rhythm, normal S1, S2, no murmurs, rubs, clicks or gallops Extremities - peripheral pulses normal, no pedal edema, no clubbing or cyanosis     Latest Ref Rng & Units 12/16/2023   10:35 AM 01/28/2023   12:29 AM 10/22/2022   11:21 AM  CMP  Glucose 70 - 99 mg/dL 888  899  894   BUN 8 - 27 mg/dL 12  15  18    Creatinine 0.76 - 1.27 mg/dL 9.21  9.18  9.21   Sodium 134 - 144 mmol/L 135  132  135   Potassium 3.5 - 5.2 mmol/L 4.5  3.5  4.3   Chloride 96 - 106 mmol/L 95  99  97   CO2 20 - 29 mmol/L 23  23  23    Calcium  8.6 - 10.2 mg/dL 9.7  9.1  9.5   Total Protein 6.0 - 8.5 g/dL 7.7   7.2   Total Bilirubin 0.0 - 1.2 mg/dL 1.1   1.2   Alkaline Phos 44 - 121 IU/L 112   93   AST 0 - 40 IU/L 22   22   ALT 0 - 44 IU/L 22   33    Lipid Panel     Component Value Date/Time   CHOL 121 12/16/2023 1035   TRIG 74 12/16/2023 1035   HDL 50 12/16/2023 1035   CHOLHDL 2.4 12/16/2023 1035   CHOLHDL 3.6 10/05/2018 0252   VLDL 33 10/05/2018 0252   LDLCALC 56 12/16/2023 1035    CBC    Component Value Date/Time   WBC 5.5 12/16/2023 1035   WBC 7.3 01/28/2023 0029   RBC 4.76 12/16/2023 1035   RBC 5.04 01/28/2023 0029   HGB 15.3 12/16/2023 1035   HGB 16.2 12/08/2009 1448   HCT 45.9 12/16/2023 1035   HCT 46.2 12/08/2009 1448   PLT 245 12/16/2023 1035   MCV 96 12/16/2023 1035   MCV 95.3 12/08/2009 1448   MCH  32.1 12/16/2023 1035   MCH 31.7 01/28/2023 0029   MCHC 33.3 12/16/2023 1035   MCHC 34.8 01/28/2023 0029   RDW 12.1 12/16/2023 1035   RDW 13.2 12/08/2009 1448   LYMPHSABS 2.3 04/26/2018 2333   LYMPHSABS 2.2 12/08/2009 1448   MONOABS 0.6 04/26/2018 2333   MONOABS 0.3 12/08/2009 1448   EOSABS 0.1 04/26/2018 2333   EOSABS 0.0 12/08/2009 1448   BASOSABS 0.0 04/26/2018 2333   BASOSABS 0.0 12/08/2009 1448    ASSESSMENT AND PLAN: 1. Type 2 diabetes, diet controlled (HCC) (Primary) Did well with dietary  changes and regular exercise to get A1C down.  Commended him on this and encouraged him to continue. - POCT glycosylated hemoglobin (Hb A1C) - POCT glucose (manual entry) - Microalbumin / creatinine urine ratio - Ambulatory referral to Ophthalmology  2. Coronary artery disease involving native coronary artery of native heart without angina pectoris Stable. Continue Crestor , ASA and metoprolol  (should be 37.5 mg BID but taking only once a day with good BP)  3. Hypertension associated with type 2 diabetes mellitus (HCC) At goal. Continue Cozaar  50 mg and Metoprolol  37.5 mg daily  4. BPH associated with nocturia Continue Flomax  - tamsulosin  (FLOMAX ) 0.4 MG CAPS capsule; Take 1 capsule (0.4 mg total) by mouth daily.  Dispense: 90 capsule; Refill: 1  5. OSA (obstructive sleep apnea) Advised to call [facility] to ask for assistance with setting up app to complete home sleep study. Provided phone #  6. Need for influenza vaccination Given today    Patient was given the opportunity to ask questions.  Patient verbalized understanding of the plan and was able to repeat key elements of the plan.   This documentation was completed using Paediatric nurse.  Any transcriptional errors are unintentional.  No orders of the defined types were placed in this encounter.    Requested Prescriptions   Pending Prescriptions Disp Refills   tamsulosin  (FLOMAX ) 0.4 MG CAPS capsule 90 capsule 1    Sig: Take 1 capsule (0.4 mg total) by mouth daily.    No follow-ups on file.  Barnie Louder, MD, FACP

## 2024-05-19 ENCOUNTER — Encounter: Payer: Self-pay | Admitting: Internal Medicine

## 2024-05-20 LAB — MICROALBUMIN / CREATININE URINE RATIO
Creatinine, Urine: 84.5 mg/dL
Microalb/Creat Ratio: 8 mg/g{creat} (ref 0–29)
Microalbumin, Urine: 7 ug/mL

## 2024-05-21 ENCOUNTER — Ambulatory Visit: Payer: Self-pay | Admitting: Internal Medicine

## 2024-05-22 LAB — POCT GLYCOSYLATED HEMOGLOBIN (HGB A1C): HbA1c, POC (controlled diabetic range): 5.8 % (ref 0.0–7.0)

## 2024-06-01 ENCOUNTER — Other Ambulatory Visit: Payer: Self-pay

## 2024-06-19 ENCOUNTER — Emergency Department (HOSPITAL_COMMUNITY)
Admission: EM | Admit: 2024-06-19 | Discharge: 2024-06-19 | Disposition: A | Discharge status: Home / Self Care | Attending: Emergency Medicine | Admitting: Emergency Medicine

## 2024-06-19 ENCOUNTER — Emergency Department (HOSPITAL_COMMUNITY)

## 2024-06-19 ENCOUNTER — Encounter (HOSPITAL_COMMUNITY): Payer: Self-pay

## 2024-06-19 ENCOUNTER — Other Ambulatory Visit: Payer: Self-pay

## 2024-06-19 DIAGNOSIS — I10 Essential (primary) hypertension: Secondary | ICD-10-CM | POA: Diagnosis not present

## 2024-06-19 DIAGNOSIS — R079 Chest pain, unspecified: Secondary | ICD-10-CM | POA: Insufficient documentation

## 2024-06-19 DIAGNOSIS — R0789 Other chest pain: Secondary | ICD-10-CM | POA: Diagnosis not present

## 2024-06-19 DIAGNOSIS — Z7982 Long term (current) use of aspirin: Secondary | ICD-10-CM | POA: Diagnosis not present

## 2024-06-19 DIAGNOSIS — I2510 Atherosclerotic heart disease of native coronary artery without angina pectoris: Secondary | ICD-10-CM | POA: Diagnosis not present

## 2024-06-19 LAB — CBC
HCT: 44.2 % (ref 39.0–52.0)
Hemoglobin: 15.3 g/dL (ref 13.0–17.0)
MCH: 32.8 pg (ref 26.0–34.0)
MCHC: 34.6 g/dL (ref 30.0–36.0)
MCV: 94.8 fL (ref 80.0–100.0)
Platelets: 174 K/uL (ref 150–400)
RBC: 4.66 MIL/uL (ref 4.22–5.81)
RDW: 12.7 % (ref 11.5–15.5)
WBC: 6.2 K/uL (ref 4.0–10.5)
nRBC: 0 % (ref 0.0–0.2)

## 2024-06-19 LAB — BASIC METABOLIC PANEL WITH GFR
Anion gap: 9 (ref 5–15)
BUN: 17 mg/dL (ref 8–23)
CO2: 26 mmol/L (ref 22–32)
Calcium: 9.2 mg/dL (ref 8.9–10.3)
Chloride: 101 mmol/L (ref 98–111)
Creatinine, Ser: 1.1 mg/dL (ref 0.61–1.24)
GFR, Estimated: >60 mL/min (ref >60)
Glucose, Bld: 117 mg/dL — ABNORMAL HIGH (ref 70–99)
Potassium: 3.7 mmol/L (ref 3.5–5.1)
Sodium: 136 mmol/L (ref 135–145)

## 2024-06-19 LAB — TROPONIN I (HIGH SENSITIVITY): Troponin I (High Sensitivity): 5 ng/L (ref <18)

## 2024-06-19 MED ORDER — LIDOCAINE VISCOUS HCL 2 % MT SOLN
15.0000 mL | Freq: Once | OROMUCOSAL | Status: AC
  Administered 2024-06-19: 15 mL via ORAL
  Filled 2024-06-19: qty 15

## 2024-06-19 MED ORDER — ASPIRIN 81 MG PO CHEW
243.0000 mg | CHEWABLE_TABLET | Freq: Once | ORAL | Status: AC
  Administered 2024-06-19: 243 mg via ORAL
  Filled 2024-06-19: qty 3

## 2024-06-19 MED ORDER — ALUM & MAG HYDROXIDE-SIMETH 200-200-20 MG/5ML PO SUSP
30.0000 mL | Freq: Once | ORAL | Status: AC
  Administered 2024-06-19: 30 mL via ORAL
  Filled 2024-06-19: qty 30

## 2024-06-19 NOTE — ED Triage Notes (Signed)
 Pt c/o left sided chest pain radiates to left arm that started yesterday. Took nitroglycerin  30 mins ago without relief. HX stent

## 2024-06-19 NOTE — Discharge Instructions (Signed)
 You were seen in the emergency department for chest pain Your EKG chest x-ray and blood work all looked okay Your symptoms improved after medications for acid reflux There is no evidence of heart attack You need to follow-up with your primary doctor to discuss today's visit and completing your sleep study for sleep apnea Return to the emergency room for severe chest pain trouble breathing or any other concerns

## 2024-06-19 NOTE — ED Notes (Signed)
 Ccmd called

## 2024-06-19 NOTE — ED Notes (Signed)
 [facility] called to report patient's discharge status.

## 2024-06-19 NOTE — ED Provider Triage Note (Signed)
 Emergency Medicine Provider Triage Evaluation Note  Andrew Hunter , a 63 y.o. male  was evaluated in triage.  Pt complains of pain since yesterday that comes and goes. Better with eating?  No SOB or nausea or vomiting.   No cough or wheezing.  Nitroglycerin  x1 dose no improvement. Better after drinking water?   Review of Systems  Positive: CP Negative: Fever   Physical Exam  BP (!) 142/76 (BP Location: Left Arm)   Pulse 74   Temp 98.1 F (36.7 C)   Resp 16   Ht 5' 7 (1.702 m)   Wt 72.6 kg   SpO2 99%   BMI 25.06 kg/m  Gen:   Awake, no distress   Resp:  Normal effort  MSK:   Moves extremities without difficulty  Other:  Abd soft NTTP  Medical Decision Making  Medically screening exam initiated at 2:55 PM.  Appropriate orders placed.  Andrew Hunter was informed that the remainder of the evaluation will be completed by another provider, this initial triage assessment does not replace that evaluation, and the importance of remaining in the ED until their evaluation is complete.  Labs, cxr, ekg   Andrew Hunter, Andrew Hunter 06/19/24 [phone]

## 2024-06-19 NOTE — ED Provider Notes (Signed)
 [place] [facility] AT [facility] Provider Note   CSN: [id number] Arrival date & time: 06/19/24  1428     Patient presents with: Chest Pain   Andrew Hunter is a 63 y.o. male.  With a history of NSTEMI, hypertension, CAD, prediabetes and obstructive sleep apnea who presents to the ED for chest pain.  Patient first experienced left-sided chest pain yesterday while at rest.  He was able to fall asleep but the chest pain returned again this morning.  Seems to be improved when drinking water.  No associated shortness of breath, diaphoresis nausea vomiting.  No recent cough congestion fevers chills or illness.  He is supposed to undergo a sleep study for sleep apnea but has not yet done so and does not use a CPAP currently.   Chest Pain      Prior to Admission medications   Medication Sig Start Date End Date Taking? Authorizing Provider  acetaminophen  (TYLENOL ) 500 MG tablet Take 500-1,000 mg by mouth every 6 (six) hours as needed (for pain or headaches).    [provider]  aspirin  EC 81 MG tablet Take 1 tablet (81 mg total) by mouth daily. 11/02/18   Vicci Barnie NOVAK, MD  calcium  carbonate (TUMS - DOSED IN MG ELEMENTAL CALCIUM ) 500 MG chewable tablet Chew 1 tablet by mouth daily as needed for indigestion or heartburn.     [provider]  cholecalciferol (VITAMIN D3) 25 MCG (1000 UNIT) tablet Take 1,000 Units by mouth daily.    [provider]  losartan  (COZAAR ) 50 MG tablet Take 1 tablet (50 mg total) by mouth daily. 01/09/24 01/08/25  Acharya, Gayatri A, MD  metoprolol  tartrate (LOPRESSOR ) 25 MG tablet Take 1.5 tablets (37.5 mg total) by mouth 2 (two) times daily. 01/09/24   Acharya, Gayatri A, MD  nitroGLYCERIN  (NITROSTAT ) 0.4 MG SL tablet Place 1 tablet (0.4 mg total) under the tongue every 5 (five) minutes, max 3 doses as needed for chest pain. 12/17/22   Acharya, Gayatri A, MD  rosuvastatin  (CRESTOR ) 10 MG tablet Take 1 tablet (10 mg  total) by mouth daily. 01/09/24   Acharya, Gayatri A, MD  tamsulosin  (FLOMAX ) 0.4 MG CAPS capsule Take 1 capsule (0.4 mg total) by mouth daily. 05/18/24   Vicci Barnie NOVAK, MD    Allergies: Atorvastatin     Review of Systems  Cardiovascular:  Positive for chest pain.    Updated Vital Signs BP (!) 142/76 (BP Location: Left Arm)   Pulse 74   Temp 98.1 F (36.7 C)   Resp 16   Ht 5' 7 (1.702 m)   Wt 72.6 kg   SpO2 99%   BMI 25.06 kg/m   Physical Exam Vitals and nursing note reviewed.  HENT:     Head: Normocephalic and atraumatic.  Eyes:     Pupils: Pupils are equal, round, and reactive to light.  Cardiovascular:     Rate and Rhythm: Normal rate and regular rhythm.  Pulmonary:     Effort: Pulmonary effort is normal.     Breath sounds: Normal breath sounds.  Abdominal:     Palpations: Abdomen is soft.     Tenderness: There is no abdominal tenderness.  Musculoskeletal:     Right lower leg: No edema.     Left lower leg: No edema.  Skin:    General: Skin is warm and dry.  Neurological:     Mental Status: He is alert.  Psychiatric:        Mood  and Affect: Mood normal.     (all labs ordered are listed, but only abnormal results are displayed) Labs Reviewed  BASIC METABOLIC PANEL WITH GFR - Abnormal; Notable for the following components:      Result Value   Glucose, Bld 117 (*)    All other components within normal limits  CBC  TROPONIN I (HIGH SENSITIVITY)    EKG: EKG Interpretation Date/Time:  Tuesday June 19 2024 14:49:56 EDT Ventricular Rate:  70 PR Interval:  160 QRS Duration:  110 QT Interval:  404 QTC Calculation: 436 R Axis:   100  Text Interpretation: Normal sinus rhythm Incomplete right bundle branch block Possible Right ventricular hypertrophy Abnormal ECG When compared with ECG of 09-Jan-2024 14:33, PREVIOUS ECG IS PRESENT Confirmed by Pamella Sharper [phone]) on 06/19/2024 3:18:11 PM  Radiology: [facility] Chest 2 View Result Date:  06/19/2024 CLINICAL DATA:  Chest pain. EXAM: CHEST - 2 VIEW COMPARISON:  Radiograph 01/28/2023 FINDINGS: The cardiomediastinal contours are normal. The lungs are clear. Pulmonary vasculature is normal. No consolidation, pleural effusion, or pneumothorax. No acute osseous abnormalities are seen. IMPRESSION: No active cardiopulmonary disease. Electronically Signed   By: Andrea Gasman M.D.   On: 06/19/2024 16:01     Procedures   Medications Ordered in the ED  aspirin  chewable tablet 243 mg (243 mg Oral Given 06/19/24 1516)  alum & mag hydroxide-simeth (MAALOX/MYLANTA) 200-200-20 MG/5ML suspension 30 mL (30 mLs Oral Given 06/19/24 1554)    And  lidocaine  (XYLOCAINE ) 2 % viscous mouth solution 15 mL (15 mLs Oral Given 06/19/24 1554)    Clinical Course as of 06/19/24 1634  Tue Jun 19, 2024  1633 Initial troponin of 5 not consistent with ACS for chest pain which developed last night.  Remainder of laboratory workup unremarkable.  Patient reports improvement in symptoms after GI cocktail here.  May be related to acid reflux but stressed the importance of following up with his primary doctor.  He will return for repeated chest pain shortness of breath or any other concerns.  Stable for discharge at this time. [MP]    Clinical Course User Index [MP] Pamella Sharper LABOR, DO                                 Medical Decision Making 63 year old male with history as above presents to the ED for chest pain beginning last night at rest.  Resolved and was able to go to sleep but returned again this morning.  Localized over the left chest.  Nitro at home did not improve.  Aspirin  in triage.  Well-appearing on my exam.  Slightly hypertensive.  Stable on room air.  No associated symptoms of shortness of breath nausea vomiting diaphoresis.  Considering extensive cardiac history we will obtain workup to evaluate for ACS dysrhythmia focal consolidation on chest x-ray, pulmonary edema, anemia electrolyte imbalance.   He does state he has had acid reflux before but this is better with cold water may be suggestive of acid reflux.  Will trial GI cocktail while awaiting results of cardiac workup and continue to monitor on telemetry.  Amount and/or Complexity of Data Reviewed Labs: ordered. Radiology: ordered.  Risk OTC drugs. Prescription drug management.        Final diagnoses:  Chest pain, unspecified type    ED Discharge Orders     None          Pamella Sharper LABOR, DO 06/19/24 1634

## 2024-06-19 NOTE — ED Triage Notes (Signed)
 Pt came in POV from home with complaints of chest pain that started yesterday. He said it was coming and going but now it is there constantly. Endorses numbness and tingling in the left arm and pain in his neck as well as complaints of a headache.  Denies weakness and dizziness

## 2024-06-29 ENCOUNTER — Other Ambulatory Visit: Payer: Self-pay

## 2024-07-27 ENCOUNTER — Other Ambulatory Visit: Payer: Self-pay

## 2024-08-11 ENCOUNTER — Encounter: Payer: Self-pay | Admitting: Internal Medicine

## 2024-08-17 ENCOUNTER — Other Ambulatory Visit (HOSPITAL_COMMUNITY): Payer: Self-pay

## 2024-08-20 ENCOUNTER — Other Ambulatory Visit (HOSPITAL_COMMUNITY): Payer: Self-pay

## 2024-09-05 ENCOUNTER — Other Ambulatory Visit: Payer: Self-pay | Admitting: Internal Medicine

## 2024-09-05 DIAGNOSIS — I10 Essential (primary) hypertension: Secondary | ICD-10-CM

## 2024-09-06 ENCOUNTER — Other Ambulatory Visit: Payer: Self-pay

## 2024-09-19 ENCOUNTER — Other Ambulatory Visit: Payer: Self-pay

## 2024-09-21 ENCOUNTER — Ambulatory Visit: Attending: Internal Medicine | Admitting: Internal Medicine

## 2024-09-21 ENCOUNTER — Other Ambulatory Visit: Payer: Self-pay

## 2024-09-21 VITALS — BP 121/66 | HR 68 | Temp 97.8°F | Ht 67.0 in | Wt 161.0 lb

## 2024-09-21 DIAGNOSIS — N401 Enlarged prostate with lower urinary tract symptoms: Secondary | ICD-10-CM

## 2024-09-21 DIAGNOSIS — I2510 Atherosclerotic heart disease of native coronary artery without angina pectoris: Secondary | ICD-10-CM | POA: Diagnosis not present

## 2024-09-21 DIAGNOSIS — L989 Disorder of the skin and subcutaneous tissue, unspecified: Secondary | ICD-10-CM | POA: Diagnosis not present

## 2024-09-21 DIAGNOSIS — I10 Essential (primary) hypertension: Secondary | ICD-10-CM

## 2024-09-21 DIAGNOSIS — E1159 Type 2 diabetes mellitus with other circulatory complications: Secondary | ICD-10-CM | POA: Diagnosis not present

## 2024-09-21 DIAGNOSIS — G8929 Other chronic pain: Secondary | ICD-10-CM | POA: Diagnosis not present

## 2024-09-21 DIAGNOSIS — R351 Nocturia: Secondary | ICD-10-CM | POA: Diagnosis not present

## 2024-09-21 DIAGNOSIS — E119 Type 2 diabetes mellitus without complications: Secondary | ICD-10-CM

## 2024-09-21 DIAGNOSIS — I152 Hypertension secondary to endocrine disorders: Secondary | ICD-10-CM

## 2024-09-21 DIAGNOSIS — M25522 Pain in left elbow: Secondary | ICD-10-CM

## 2024-09-21 LAB — POCT GLYCOSYLATED HEMOGLOBIN (HGB A1C): HbA1c, POC (controlled diabetic range): 6.4 % (ref 0.0–7.0)

## 2024-09-21 LAB — GLUCOSE, POCT (MANUAL RESULT ENTRY): POC Glucose: 127 mg/dL — AB (ref 70–99)

## 2024-09-21 MED ORDER — NITROGLYCERIN 0.4 MG SL SUBL: 0.4000 mg | SUBLINGUAL_TABLET | SUBLINGUAL | 3 refills | Status: DC | PRN

## 2024-09-21 MED ORDER — NITROGLYCERIN 0.4 MG SL SUBL: 0.4000 mg | SUBLINGUAL_TABLET | SUBLINGUAL | 3 refills | Status: AC | PRN

## 2024-09-21 MED ORDER — TAMSULOSIN HCL 0.4 MG PO CAPS: 0.4000 mg | ORAL_CAPSULE | Freq: Every day | ORAL | 1 refills | Status: AC

## 2024-09-21 NOTE — Progress Notes (Signed)
 "   Patient ID: Andrew Hunter, male    DOB: 23-Jun-1961  MRN: [id number]  CC: Hypertension (HTN f/u. Raguel about brown spots on skin /Already received flu vax)   Subjective: Andrew Hunter is a 64 y.o. male who presents for chronic ds management. His chronic medical issues include:  Patient with history of HTN, preDM, CAD with hx of NSTEMI, GERD, OSA CPAP but not using consistently, BPH, urge incont   Discussed the use of AI scribe software for clinical note transcription with the patient, who gave verbal consent to proceed.  History of Present Illness Andrew Hunter is a 64 year old male with diabetes, hypertension, and heart disease who presents for a four-month follow-up.  HTN/CAD: He experiences occasional mild chest pain, particularly when his sleep is disturbed, which is alleviated by drinking water. He does not experience chest pain during physical exertion such as walking or climbing stairs. He continues to take metoprolol  25 mg,1.5 tablets twice daily, Cozaar  50 mg, aspirin  81 mg daily, and rosuvastatin  10 mg daily for his heart disease and blood pressure management.  DM: Results for orders placed or performed in visit on 09/21/24  POCT glucose (manual entry)   Collection Time: 09/21/24  8:46 AM  Result Value Ref Range   POC Glucose 127 (A) 70 - 99 mg/dl  POCT glycosylated hemoglobin (Hb A1C)   Collection Time: 09/21/24  8:48 AM  Result Value Ref Range   Hemoglobin A1C     HbA1c POC (<> result, manual entry)     HbA1c, POC (prediabetic range)     HbA1c, POC (controlled diabetic range) 6.4 0.0 - 7.0 %  His A1c has increased from 5.8% in September to 6.4% today, with a fasting blood sugar of 127 mg/dL. He manages his diabetes through diet and exercise, avoiding medication. He uses honey in his tea and coffee and occasionally sugar when honey is unavailable. He exercises regularly, riding a stationary bike or walking on a treadmill for about an hour daily, though he has reduced  outdoor biking due to cold weather.  He has persistent pain in his left elbow, which began over a year ago after a fall while holding his grandchild. Fell backwards and elbow hit the floor. The pain is localized to the lateral elbow and has not improved over time. He has not had any imaging done and reports no swelling.  He is concerned about brown spots on his skin, which he noticed 3-5 years ago. He stopped taking fish oil supplements over a year ago, suspecting a connection, but the spots remain. His sister also has similar spots, which he associates with aging.  He has a history of an enlarged prostate and is currently taking tamsulosin . He urinates two times at night, which is an improvement from before starting the medication. No difficulty with urine flow.    Patient Active Problem List   Diagnosis Date Noted   Bilateral impacted cerumen 08/07/2020   Benign prostatic hyperplasia with lower urinary tract symptoms 08/07/2020   Urge incontinence of urine 08/07/2020   OSA on CPAP 08/07/2020   Overweight (BMI 25.0-29.9) 08/07/2020   Prediabetes 05/27/2019   Essential hypertension 11/02/2018   Gastroesophageal reflux disease without esophagitis 11/02/2018   Coronary artery disease of native artery of native heart with stable angina pectoris 11/02/2018   NSTEMI (non-ST elevated myocardial infarction) (HCC) 10/04/2018     Medications Ordered Prior to Encounter[1]  Allergies[2]  Social History   Socioeconomic History   Marital status: Married  Spouse name: Not on file   Number of children: Not on file   Years of education: Not on file   Highest education level: GED or equivalent  Occupational History   Not on file  Tobacco Use   Smoking status: Never   Smokeless tobacco: Never  Vaping Use   Vaping status: Never Used  Substance and Sexual Activity   Alcohol use: Yes    Comment: 2 beers/month   Drug use: Never   Sexual activity: Yes  Other Topics Concern   Not on file   Social History Narrative   Lives locally with wife.  Does not routinely exercise.  Works in therapist, sports.   Social Drivers of Health   Tobacco Use: Low Risk (06/19/2024)   Patient History    Smoking Tobacco Use: Never    Smokeless Tobacco Use: Never    Passive Exposure: Not on file  Financial Resource Strain: Low Risk (05/17/2024)   Overall Financial Resource Strain (CARDIA)    Difficulty of Paying Living Expenses: Not very hard  Food Insecurity: No Food Insecurity (05/17/2024)   Epic    Worried About Radiation Protection Practitioner of Food in the Last Year: Never true    Ran Out of Food in the Last Year: Never true  Transportation Needs: No Transportation Needs (05/17/2024)   Epic    Lack of Transportation (Medical): No    Lack of Transportation (Non-Medical): No  Physical Activity: Sufficiently Active (05/17/2024)   Exercise Vital Sign    Days of Exercise per Week: 6 days    Minutes of Exercise per Session: 30 min  Stress: No Stress Concern Present (05/17/2024)   [employer] of Occupational Health - Occupational Stress Questionnaire    Feeling of Stress: Not at all  Social Connections: Socially Integrated (05/17/2024)   Social Connection and Isolation Panel    Frequency of Communication with Friends and Family: Once a week    Frequency of Social Gatherings with Friends and Family: More than three times a week    Attends Religious Services: More than 4 times per year    Active Member of [employer] or Organizations: Yes    Attends Banker Meetings: More than 4 times per year    Marital Status: Married  Catering Manager Violence: Not At Risk (12/16/2023)   Humiliation, Afraid, Rape, and Kick questionnaire    Fear of Current or Ex-Partner: No    Emotionally Abused: No    Physically Abused: No    Sexually Abused: No  Depression (PHQ2-9): Low Risk (05/18/2024)   Depression (PHQ2-9)    PHQ-2 Score: 0  Alcohol Screen: Low Risk (05/17/2024)   Alcohol Screen    Last Alcohol Screening Score  (AUDIT): 1  Housing: Low Risk (05/17/2024)   Epic    Unable to Pay for Housing in the Last Year: No    Number of Times Moved in the Last Year: 0    Homeless in the Last Year: No  Utilities: Not At Risk (12/16/2023)   [employer] Utilities    Threatened with loss of utilities: No  Health Literacy: Adequate Health Literacy (12/16/2023)   B1300 Health Literacy    Frequency of need for help with medical instructions: Never    Family History  Problem Relation Age of Onset   Cancer Mother        liver   Heart attack Father        died @ 69    Past Surgical History:  Procedure Laterality Date  CORONARY PRESSURE/FFR STUDY N/A 10/05/2018   Procedure: INTRAVASCULAR PRESSURE WIRE/FFR STUDY;  Surgeon: Court Dorn PARAS, MD;  Location: [facility];  Service: Cardiovascular;  Laterality: N/A;  FFR LAD   CORONARY STENT INTERVENTION N/A 10/05/2018   Procedure: CORONARY STENT INTERVENTION;  Surgeon: Court Dorn PARAS, MD;  Location: [facility];  Service: Cardiovascular;  Laterality: N/A;  MID LAD   LEFT HEART CATH AND CORONARY ANGIOGRAPHY N/A 10/05/2018   Procedure: LEFT HEART CATH AND CORONARY ANGIOGRAPHY;  Surgeon: Court Dorn PARAS, MD;  Location: [facility];  Service: Cardiovascular;  Laterality: N/A;    ROS: Review of Systems Negative except as stated above  PHYSICAL EXAM: BP 121/66 (BP Location: Left Arm, Patient Position: Sitting, Cuff Size: Normal)   Pulse 68   Temp 97.8 F (36.6 C) (Oral)   Ht 5' 7 (1.702 m)   Wt 161 lb (73 kg)   SpO2 99%   BMI 25.22 kg/m   Wt Readings from Last 3 Encounters:  09/21/24 161 lb (73 kg)  06/19/24 160 lb (72.6 kg)  05/18/24 158 lb (71.7 kg)    Physical Exam General appearance - alert, well appearing, and in no distress Neck - bilateral symmetric anterior adenopathy Chest - clear to auscultation, no wheezes, rales or rhonchi, symmetric air entry Heart - normal rate, regular rhythm, normal S1, S2, no murmurs, rubs, clicks or  gallops Extremities - peripheral pulses normal, no pedal edema, no clubbing or cyanosis Skin: numerous hyperpigemented macular spots on both forearm MSK: LT elbow: no edema/erythema. Good ROM. Mild tenderness over LT epicondyle     Latest Ref Rng & Units 06/19/2024    2:47 PM 12/16/2023   10:35 AM 01/28/2023   12:29 AM  CMP  Glucose 70 - 99 mg/dL 882  888  899   BUN 8 - 23 mg/dL 17  12  15    Creatinine 0.61 - 1.24 mg/dL 8.89  9.21  9.18   Sodium 135 - 145 mmol/L 136  135  132   Potassium 3.5 - 5.1 mmol/L 3.7  4.5  3.5   Chloride 98 - 111 mmol/L 101  95  99   CO2 22 - 32 mmol/L 26  23  23    Calcium  8.9 - 10.3 mg/dL 9.2  9.7  9.1   Total Protein 6.0 - 8.5 g/dL  7.7    Total Bilirubin 0.0 - 1.2 mg/dL  1.1    Alkaline Phos 44 - 121 IU/L  112    AST 0 - 40 IU/L  22    ALT 0 - 44 IU/L  22     Lipid Panel     Component Value Date/Time   CHOL 121 12/16/2023 1035   TRIG 74 12/16/2023 1035   HDL 50 12/16/2023 1035   CHOLHDL 2.4 12/16/2023 1035   CHOLHDL 3.6 10/05/2018 0252   VLDL 33 10/05/2018 0252   LDLCALC 56 12/16/2023 1035    CBC    Component Value Date/Time   WBC 6.2 06/19/2024 1447   RBC 4.66 06/19/2024 1447   HGB 15.3 06/19/2024 1447   HGB 15.3 12/16/2023 1035   HGB 16.2 12/08/2009 1448   HCT 44.2 06/19/2024 1447   HCT 45.9 12/16/2023 1035   HCT 46.2 12/08/2009 1448   PLT 174 06/19/2024 1447   PLT 245 12/16/2023 1035   MCV 94.8 06/19/2024 1447   MCV 96 12/16/2023 1035   MCV 95.3 12/08/2009 1448   MCH 32.8 06/19/2024 1447   MCHC  34.6 06/19/2024 1447   RDW 12.7 06/19/2024 1447   RDW 12.1 12/16/2023 1035   RDW 13.2 12/08/2009 1448   LYMPHSABS 2.3 04/26/2018 2333   LYMPHSABS 2.2 12/08/2009 1448   MONOABS 0.6 04/26/2018 2333   MONOABS 0.3 12/08/2009 1448   EOSABS 0.1 04/26/2018 2333   EOSABS 0.0 12/08/2009 1448   BASOSABS 0.0 04/26/2018 2333   BASOSABS 0.0 12/08/2009 1448    ASSESSMENT AND PLAN: 1. Type 2 diabetes, diet controlled (HCC) (Primary) At goal and  diet control Continue current diet and exercise regimen. - POCT glycosylated hemoglobin (Hb A1C) - POCT glucose (manual entry)  2. Hypertension associated with type 2 diabetes mellitus (HCC) At goal on Metoprolol  37.5 mg BID and Cozaar  50 mg daily. Continue meds  3. Coronary artery disease involving native coronary artery of native heart without angina pectoris Stable. Continue Metoprolol , Crestor  and ASA  4. BPH associated with nocturia Stable on Flomax  - tamsulosin  (FLOMAX ) 0.4 MG CAPS capsule; Take 1 capsule (0.4 mg total) by mouth daily.  Dispense: 90 capsule; Refill: 1   5. 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  Even if he fractured it, it has been over a year so it should have healed.  Recommend purchasing diclofenac  gel over-the-counter and using as needed.  If no improvement we can refer to orthopedics.  6. Skin lesions Suspect probably only spots. - Ambulatory referral to Dermatology    Patient was given the opportunity to ask questions.  Patient verbalized understanding of the plan and was able to repeat key elements of the plan.   This documentation was completed using Paediatric nurse.  Any transcriptional errors are unintentional.  Orders Placed This Encounter  Procedures   Ambulatory referral to Dermatology   POCT glycosylated hemoglobin (Hb A1C)   POCT glucose (manual entry)     Requested Prescriptions   Signed Prescriptions Disp Refills   tamsulosin  (FLOMAX ) 0.4 MG CAPS capsule 90 capsule 1    Sig: Take 1 capsule (0.4 mg total) by mouth daily.   nitroGLYCERIN  (NITROSTAT ) 0.4 MG SL tablet 25 tablet 3    Sig: Place 1 tablet (0.4 mg total) under the tongue every 5 (five) minutes, max 3 doses as needed for chest pain.    Return in about 4 months (around 01/19/2025) for for physical and chronic ds management.  Barnie Louder, MD, FACP     [1]  Current Outpatient Medications on File Prior to Visit   Medication Sig Dispense Refill   acetaminophen  (TYLENOL ) 500 MG tablet Take 500-1,000 mg by mouth every 6 (six) hours as needed (for pain or headaches).     aspirin  EC 81 MG tablet Take 1 tablet (81 mg total) by mouth daily. 90 tablet 3   calcium  carbonate (TUMS - DOSED IN MG ELEMENTAL CALCIUM ) 500 MG chewable tablet Chew 1 tablet by mouth daily as needed for indigestion or heartburn.      cholecalciferol (VITAMIN D3) 25 MCG (1000 UNIT) tablet Take 1,000 Units by mouth daily.     losartan  (COZAAR ) 50 MG tablet Take 1 tablet (50 mg total) by mouth daily. 90 tablet 3   metoprolol  tartrate (LOPRESSOR ) 25 MG tablet Take 1.5 tablets (37.5 mg total) by mouth 2 (two) times daily. 90 tablet 6   rosuvastatin  (CRESTOR ) 10 MG tablet Take 1 tablet (10 mg total) by mouth daily. 90 tablet 3   No current facility-administered medications on file prior to visit.  [2]  Allergies Allergen Reactions   Atorvastatin  Other (  See Comments)    Chest pains   "

## 2024-09-21 NOTE — Patient Instructions (Signed)
" °  VISIT SUMMARY: Andrew Hunter, a 64 year old male with diabetes, hypertension, and heart disease, came in for a four-month follow-up. He reported occasional mild chest pain, an increase in A1c levels, persistent left elbow pain, and concerns about brown spots on his skin. His blood pressure is well-controlled, and he manages his diabetes through diet and exercise. He also has a history of an enlarged prostate and is currently taking tamsulosin .  YOUR PLAN: -TYPE 2 DIABETES MELLITUS, DIET CONTROLLED: Your A1c level has increased to 6.4%, which is still below the target. This means your blood sugar levels are slightly higher than before. Continue with your current diet and exercise regimen, and avoid sugary snacks and drinks.  -ATHEROSCLEROTIC HEART DISEASE OF NATIVE CORONARY ARTERY WITH STABLE ANGINA PECTORIS: You have stable angina, which is chest pain due to reduced blood flow to the heart. Your chest pain occurs occasionally at night and is relieved by drinking water. Continue taking metoprolol , aspirin , and rosuvastatin  as prescribed. A refill for nitroglycerin  has been sent to your pharmacy.  -HYPERTENSION ASSOCIATED WITH TYPE 2 DIABETES MELLITUS: Your blood pressure is well-controlled at 121/67. Continue taking your current antihypertensive medications.  -BENIGN PROSTATIC HYPERPLASIA WITH LOWER URINARY TRACT SYMPTOMS: You have an enlarged prostate, which causes you to urinate two to three times at night. Continue taking tamsulosin  as prescribed. A refill has been sent to your pharmacy.  -CHRONIC LEFT ELBOW PAIN: You have persistent pain in your left elbow, likely due to a past injury. Purchase and use an anti-inflammatory gel from over the counter called Diclofenac  gel for pain relief.  -GENERAL HEALTH MAINTENANCE: The brown spots on your skin are likely age-related and not linked to fish oil. A referral to a dermatologist for further evaluation will be considered.  INSTRUCTIONS: Continue with  your current medications and lifestyle changes. Use diclofenac  gel for elbow pain. Follow up with your primary care physician in four months or sooner if you experience any new or worsening symptoms. A referral to a dermatologist for skin evaluation will be considered.    Contains text generated by [employer].   "

## 2025-01-22 ENCOUNTER — Ambulatory Visit: Payer: Self-pay | Admitting: Internal Medicine

## 2025-01-25 ENCOUNTER — Ambulatory Visit: Admitting: Internal Medicine
